# Patient Record
Sex: Female | Born: 1955 | Race: White | Hispanic: No | Marital: Married | Smoking: Never smoker
Health system: Southern US, Community
[De-identification: ages and names within clinical notes are randomized; demographics above are authoritative.]

## PROBLEM LIST (undated history)

## (undated) DIAGNOSIS — Z889 Allergy status to unspecified drugs, medicaments and biological substances status: Secondary | ICD-10-CM

## (undated) DIAGNOSIS — M1990 Unspecified osteoarthritis, unspecified site: Secondary | ICD-10-CM

## (undated) DIAGNOSIS — M51369 Other intervertebral disc degeneration, lumbar region without mention of lumbar back pain or lower extremity pain: Secondary | ICD-10-CM

## (undated) DIAGNOSIS — T783XXA Angioneurotic edema, initial encounter: Secondary | ICD-10-CM

## (undated) DIAGNOSIS — M5136 Other intervertebral disc degeneration, lumbar region: Secondary | ICD-10-CM

## (undated) DIAGNOSIS — N840 Polyp of corpus uteri: Secondary | ICD-10-CM

## (undated) DIAGNOSIS — K219 Gastro-esophageal reflux disease without esophagitis: Secondary | ICD-10-CM

## (undated) DIAGNOSIS — F329 Major depressive disorder, single episode, unspecified: Secondary | ICD-10-CM

## (undated) DIAGNOSIS — F419 Anxiety disorder, unspecified: Secondary | ICD-10-CM

## (undated) DIAGNOSIS — R51 Headache: Secondary | ICD-10-CM

## (undated) DIAGNOSIS — F32A Depression, unspecified: Secondary | ICD-10-CM

## (undated) DIAGNOSIS — M549 Dorsalgia, unspecified: Secondary | ICD-10-CM

## (undated) DIAGNOSIS — L439 Lichen planus, unspecified: Secondary | ICD-10-CM

## (undated) DIAGNOSIS — K589 Irritable bowel syndrome without diarrhea: Secondary | ICD-10-CM

## (undated) DIAGNOSIS — N943 Premenstrual tension syndrome: Secondary | ICD-10-CM

## (undated) HISTORY — DX: Irritable bowel syndrome, unspecified: K58.9

## (undated) HISTORY — PX: TUBAL LIGATION: SHX77

## (undated) HISTORY — DX: Polyp of corpus uteri: N84.0

## (undated) HISTORY — DX: Lichen planus, unspecified: L43.9

## (undated) HISTORY — PX: WISDOM TOOTH EXTRACTION: SHX21

## (undated) HISTORY — DX: Premenstrual tension syndrome: N94.3

## (undated) HISTORY — PX: COLONOSCOPY: SHX174

## (undated) HISTORY — DX: Other intervertebral disc degeneration, lumbar region: M51.36

## (undated) HISTORY — DX: Other intervertebral disc degeneration, lumbar region without mention of lumbar back pain or lower extremity pain: M51.369

## (undated) HISTORY — PX: TONSILLECTOMY: SUR1361

## (undated) HISTORY — PX: DILATION AND CURETTAGE OF UTERUS: SHX78

## (undated) HISTORY — PX: CHOLECYSTECTOMY: SHX55

## (undated) HISTORY — DX: Angioneurotic edema, initial encounter: T78.3XXA

## (undated) HISTORY — DX: Unspecified osteoarthritis, unspecified site: M19.90

---

## 1999-04-19 ENCOUNTER — Ambulatory Visit (HOSPITAL_COMMUNITY): Admission: RE | Admit: 1999-04-19 | Discharge: 1999-04-19 | Payer: Self-pay | Admitting: Family Medicine

## 1999-04-19 ENCOUNTER — Encounter: Payer: Self-pay | Admitting: Family Medicine

## 1999-05-06 ENCOUNTER — Other Ambulatory Visit: Admission: RE | Admit: 1999-05-06 | Discharge: 1999-05-06 | Payer: Self-pay | Admitting: Obstetrics and Gynecology

## 2000-06-30 ENCOUNTER — Other Ambulatory Visit: Admission: RE | Admit: 2000-06-30 | Discharge: 2000-06-30 | Payer: Self-pay | Admitting: Obstetrics and Gynecology

## 2002-05-07 ENCOUNTER — Other Ambulatory Visit: Admission: RE | Admit: 2002-05-07 | Discharge: 2002-05-07 | Payer: Self-pay | Admitting: Obstetrics and Gynecology

## 2003-05-13 ENCOUNTER — Other Ambulatory Visit: Admission: RE | Admit: 2003-05-13 | Discharge: 2003-05-13 | Payer: Self-pay | Admitting: Obstetrics and Gynecology

## 2004-05-20 ENCOUNTER — Other Ambulatory Visit: Admission: RE | Admit: 2004-05-20 | Discharge: 2004-05-20 | Payer: Self-pay | Admitting: Obstetrics and Gynecology

## 2004-08-25 ENCOUNTER — Ambulatory Visit: Payer: Self-pay | Admitting: Internal Medicine

## 2005-01-26 ENCOUNTER — Ambulatory Visit: Payer: Self-pay | Admitting: Family Medicine

## 2006-02-08 ENCOUNTER — Other Ambulatory Visit: Admission: RE | Admit: 2006-02-08 | Discharge: 2006-02-08 | Payer: Self-pay | Admitting: Obstetrics and Gynecology

## 2007-02-14 ENCOUNTER — Other Ambulatory Visit: Admission: RE | Admit: 2007-02-14 | Discharge: 2007-02-14 | Payer: Self-pay | Admitting: Obstetrics and Gynecology

## 2008-08-06 ENCOUNTER — Other Ambulatory Visit: Admission: RE | Admit: 2008-08-06 | Discharge: 2008-08-06 | Payer: Self-pay | Admitting: Obstetrics and Gynecology

## 2009-01-25 ENCOUNTER — Emergency Department: Payer: Self-pay | Admitting: Emergency Medicine

## 2009-10-22 ENCOUNTER — Emergency Department: Payer: Self-pay | Admitting: Internal Medicine

## 2009-12-28 ENCOUNTER — Other Ambulatory Visit: Admission: RE | Admit: 2009-12-28 | Discharge: 2009-12-28 | Payer: Self-pay | Admitting: Obstetrics and Gynecology

## 2011-01-05 ENCOUNTER — Other Ambulatory Visit: Payer: Self-pay | Admitting: Obstetrics and Gynecology

## 2011-01-05 ENCOUNTER — Other Ambulatory Visit (HOSPITAL_COMMUNITY)
Admission: RE | Admit: 2011-01-05 | Discharge: 2011-01-05 | Disposition: A | Discharge status: Home / Self Care | Payer: BC Managed Care – PPO | Source: Ambulatory Visit | Attending: Obstetrics and Gynecology | Admitting: Obstetrics and Gynecology

## 2011-01-05 DIAGNOSIS — Z01419 Encounter for gynecological examination (general) (routine) without abnormal findings: Secondary | ICD-10-CM | POA: Insufficient documentation

## 2011-06-07 ENCOUNTER — Other Ambulatory Visit: Payer: Self-pay | Admitting: Obstetrics and Gynecology

## 2012-01-10 ENCOUNTER — Other Ambulatory Visit (HOSPITAL_COMMUNITY)
Admission: RE | Admit: 2012-01-10 | Discharge: 2012-01-10 | Disposition: A | Discharge status: Home / Self Care | Payer: BC Managed Care – PPO | Source: Ambulatory Visit | Attending: Obstetrics and Gynecology | Admitting: Obstetrics and Gynecology

## 2012-01-10 ENCOUNTER — Other Ambulatory Visit: Payer: Self-pay | Admitting: Obstetrics and Gynecology

## 2012-01-10 DIAGNOSIS — Z01419 Encounter for gynecological examination (general) (routine) without abnormal findings: Secondary | ICD-10-CM | POA: Insufficient documentation

## 2012-06-07 ENCOUNTER — Encounter (HOSPITAL_COMMUNITY): Payer: Self-pay | Admitting: *Deleted

## 2012-06-13 ENCOUNTER — Encounter (HOSPITAL_COMMUNITY): Payer: Self-pay | Admitting: Pharmacist

## 2012-07-11 ENCOUNTER — Other Ambulatory Visit: Payer: Self-pay | Admitting: Obstetrics and Gynecology

## 2012-07-12 ENCOUNTER — Encounter (HOSPITAL_COMMUNITY)
Admission: RE | Disposition: A | Discharge status: Home / Self Care | Payer: Self-pay | Source: Ambulatory Visit | Attending: Obstetrics and Gynecology

## 2012-07-12 ENCOUNTER — Ambulatory Visit (HOSPITAL_COMMUNITY): Payer: BC Managed Care – PPO | Admitting: Anesthesiology

## 2012-07-12 ENCOUNTER — Ambulatory Visit (HOSPITAL_COMMUNITY)
Admission: RE | Admit: 2012-07-12 | Discharge: 2012-07-12 | Disposition: A | Discharge status: Home / Self Care | Payer: BC Managed Care – PPO | Source: Ambulatory Visit | Attending: Obstetrics and Gynecology | Admitting: Obstetrics and Gynecology

## 2012-07-12 ENCOUNTER — Encounter (HOSPITAL_COMMUNITY): Payer: Self-pay | Admitting: *Deleted

## 2012-07-12 ENCOUNTER — Encounter (HOSPITAL_COMMUNITY): Payer: Self-pay | Admitting: Anesthesiology

## 2012-07-12 DIAGNOSIS — R9389 Abnormal findings on diagnostic imaging of other specified body structures: Secondary | ICD-10-CM | POA: Insufficient documentation

## 2012-07-12 DIAGNOSIS — N950 Postmenopausal bleeding: Secondary | ICD-10-CM | POA: Insufficient documentation

## 2012-07-12 DIAGNOSIS — N840 Polyp of corpus uteri: Secondary | ICD-10-CM | POA: Insufficient documentation

## 2012-07-12 HISTORY — DX: Allergy status to unspecified drugs, medicaments and biological substances: Z88.9

## 2012-07-12 HISTORY — DX: Dorsalgia, unspecified: M54.9

## 2012-07-12 HISTORY — DX: Gastro-esophageal reflux disease without esophagitis: K21.9

## 2012-07-12 HISTORY — DX: Unspecified osteoarthritis, unspecified site: M19.90

## 2012-07-12 HISTORY — DX: Major depressive disorder, single episode, unspecified: F32.9

## 2012-07-12 HISTORY — DX: Depression, unspecified: F32.A

## 2012-07-12 HISTORY — PX: DILATATION & CURRETTAGE/HYSTEROSCOPY WITH RESECTOCOPE: SHX5572

## 2012-07-12 HISTORY — DX: Headache: R51

## 2012-07-12 LAB — BASIC METABOLIC PANEL
BUN: 13 mg/dL (ref 6–23)
CO2: 29 mEq/L (ref 19–32)
Calcium: 9 mg/dL (ref 8.4–10.5)
Chloride: 102 mEq/L (ref 96–112)
Creatinine, Ser: 0.7 mg/dL (ref 0.50–1.10)
GFR calc Af Amer: >90 mL/min (ref >90)
GFR calc non Af Amer: >90 mL/min (ref >90)
Glucose, Bld: 101 mg/dL — ABNORMAL HIGH (ref 70–99)
Potassium: 3.5 mEq/L (ref 3.5–5.1)
Sodium: 140 mEq/L (ref 135–145)

## 2012-07-12 LAB — CBC
HCT: 39.8 % (ref 36.0–46.0)
Hemoglobin: 13.1 g/dL (ref 12.0–15.0)
MCH: 30.3 pg (ref 26.0–34.0)
MCHC: 32.9 g/dL (ref 30.0–36.0)
MCV: 91.9 fL (ref 78.0–100.0)
Platelets: 283 10*3/uL (ref 150–400)
RBC: 4.33 MIL/uL (ref 3.87–5.11)
RDW: 14.3 % (ref 11.5–15.5)
WBC: 5.6 10*3/uL (ref 4.0–10.5)

## 2012-07-12 SURGERY — DILATATION & CURETTAGE/HYSTEROSCOPY WITH RESECTOCOPE
Anesthesia: General | Wound class: Clean Contaminated

## 2012-07-12 MED ORDER — ONDANSETRON HCL 4 MG/2ML IJ SOLN
INTRAMUSCULAR | Status: DC | PRN
  Administered 2012-07-12: 4 mg via INTRAVENOUS

## 2012-07-12 MED ORDER — MIDAZOLAM HCL 5 MG/5ML IJ SOLN
INTRAMUSCULAR | Status: DC | PRN
  Administered 2012-07-12: 2 mg via INTRAVENOUS

## 2012-07-12 MED ORDER — LACTATED RINGERS IV SOLN
INTRAVENOUS | Status: DC
  Administered 2012-07-12 (×2): via INTRAVENOUS

## 2012-07-12 MED ORDER — KETOROLAC TROMETHAMINE 30 MG/ML IJ SOLN
INTRAMUSCULAR | Status: AC
  Filled 2012-07-12: qty 1

## 2012-07-12 MED ORDER — FENTANYL CITRATE 0.05 MG/ML IJ SOLN
INTRAMUSCULAR | Status: DC | PRN
  Administered 2012-07-12 (×2): 25 ug via INTRAVENOUS
  Administered 2012-07-12: 50 ug via INTRAVENOUS

## 2012-07-12 MED ORDER — BUPIVACAINE HCL (PF) 0.25 % IJ SOLN
INTRAMUSCULAR | Status: AC
  Filled 2012-07-12: qty 30

## 2012-07-12 MED ORDER — IBUPROFEN 800 MG PO TABS: 800.0000 mg | ORAL_TABLET | Freq: Three times a day (TID) | ORAL | Status: DC | PRN

## 2012-07-12 MED ORDER — DEXAMETHASONE SODIUM PHOSPHATE 10 MG/ML IJ SOLN
INTRAMUSCULAR | Status: AC
  Filled 2012-07-12: qty 1

## 2012-07-12 MED ORDER — MEPERIDINE HCL 25 MG/ML IJ SOLN: 6.2500 mg | INTRAMUSCULAR | Status: DC | PRN

## 2012-07-12 MED ORDER — KETOROLAC TROMETHAMINE 30 MG/ML IJ SOLN
INTRAMUSCULAR | Status: DC | PRN
  Administered 2012-07-12: 30 mg via INTRAVENOUS

## 2012-07-12 MED ORDER — LIDOCAINE HCL (CARDIAC) 20 MG/ML IV SOLN
INTRAVENOUS | Status: DC | PRN
  Administered 2012-07-12: 50 mg via INTRAVENOUS

## 2012-07-12 MED ORDER — FENTANYL CITRATE 0.05 MG/ML IJ SOLN
25.0000 ug | INTRAMUSCULAR | Status: DC | PRN
  Administered 2012-07-12 (×2): 50 ug via INTRAVENOUS

## 2012-07-12 MED ORDER — PROMETHAZINE HCL 25 MG/ML IJ SOLN: 6.2500 mg | INTRAMUSCULAR | Status: DC | PRN

## 2012-07-12 MED ORDER — DOXYCYCLINE HYCLATE 50 MG PO CAPS: 100.0000 mg | ORAL_CAPSULE | Freq: Every day | ORAL | Status: DC

## 2012-07-12 MED ORDER — FENTANYL CITRATE 0.05 MG/ML IJ SOLN
INTRAMUSCULAR | Status: AC
  Filled 2012-07-12: qty 2

## 2012-07-12 MED ORDER — LACTATED RINGERS IV SOLN: INTRAVENOUS | Status: DC

## 2012-07-12 MED ORDER — PROPOFOL 10 MG/ML IV EMUL
INTRAVENOUS | Status: AC
  Filled 2012-07-12: qty 20

## 2012-07-12 MED ORDER — IBUPROFEN 200 MG PO TABS: 200.0000 mg | ORAL_TABLET | Freq: Three times a day (TID) | ORAL | Status: DC | PRN

## 2012-07-12 MED ORDER — LIDOCAINE HCL (CARDIAC) 20 MG/ML IV SOLN
INTRAVENOUS | Status: AC
  Filled 2012-07-12: qty 5

## 2012-07-12 MED ORDER — FENTANYL CITRATE 0.05 MG/ML IJ SOLN
INTRAMUSCULAR | Status: AC
  Administered 2012-07-12: 50 ug via INTRAVENOUS
  Filled 2012-07-12: qty 2

## 2012-07-12 MED ORDER — DEXAMETHASONE SODIUM PHOSPHATE 10 MG/ML IJ SOLN
INTRAMUSCULAR | Status: DC | PRN
  Administered 2012-07-12: 10 mg via INTRAVENOUS

## 2012-07-12 MED ORDER — BUPIVACAINE HCL (PF) 0.25 % IJ SOLN
INTRAMUSCULAR | Status: DC | PRN
  Administered 2012-07-12: 20 mL

## 2012-07-12 MED ORDER — MIDAZOLAM HCL 2 MG/2ML IJ SOLN
INTRAMUSCULAR | Status: AC
  Filled 2012-07-12: qty 2

## 2012-07-12 MED ORDER — ONDANSETRON HCL 4 MG/2ML IJ SOLN
INTRAMUSCULAR | Status: AC
  Filled 2012-07-12: qty 2

## 2012-07-12 MED ORDER — PROPOFOL 10 MG/ML IV BOLUS
INTRAVENOUS | Status: DC | PRN
  Administered 2012-07-12: 200 mg via INTRAVENOUS

## 2012-07-12 SURGICAL SUPPLY — 17 items
CANISTER SUCTION 2500CC (MISCELLANEOUS) ×2 IMPLANT
CATH ROBINSON RED A/P 16FR (CATHETERS) ×2 IMPLANT
CLOTH BEACON ORANGE TIMEOUT ST (SAFETY) ×2 IMPLANT
CONTAINER PREFILL 10% NBF 60ML (FORM) ×4 IMPLANT
DRESSING TELFA 8X3 (GAUZE/BANDAGES/DRESSINGS) ×2 IMPLANT
ELECT REM PT RETURN 9FT ADLT (ELECTROSURGICAL) ×2
ELECTRODE REM PT RTRN 9FT ADLT (ELECTROSURGICAL) ×1 IMPLANT
GLOVE BIOGEL M 6.5 STRL (GLOVE) ×4 IMPLANT
GLOVE BIOGEL PI IND STRL 6.5 (GLOVE) ×1 IMPLANT
GLOVE BIOGEL PI INDICATOR 6.5 (GLOVE) ×1
GOWN PREVENTION PLUS XLARGE (GOWN DISPOSABLE) ×2 IMPLANT
GOWN STRL REIN XL XLG (GOWN DISPOSABLE) ×4 IMPLANT
LOOP ANGLED CUTTING 22FR (CUTTING LOOP) IMPLANT
PACK HYSTEROSCOPY LF (CUSTOM PROCEDURE TRAY) ×2 IMPLANT
PAD OB MATERNITY 4.3X12.25 (PERSONAL CARE ITEMS) ×2 IMPLANT
TOWEL OR 17X24 6PK STRL BLUE (TOWEL DISPOSABLE) ×4 IMPLANT
WATER STERILE IRR 1000ML POUR (IV SOLUTION) ×2 IMPLANT

## 2012-07-12 NOTE — Anesthesia Preprocedure Evaluation (Addendum)
Anesthesia Evaluation  Patient identified by MRN, date of birth, ID band Patient awake    Reviewed: Allergy & Precautions, H&P , NPO status , Patient's Chart, lab work & pertinent test results  Airway Mallampati: III TM Distance: <3 FB Neck ROM: Full  Mouth opening: Limited Mouth Opening Comment: Anterior. Small mouth Dental  (+) Chipped   Pulmonary neg pulmonary ROS,          Cardiovascular negative cardio ROS      Neuro/Psych  Headaches, negative neurological ROS  negative psych ROS   GI/Hepatic negative GI ROS, Neg liver ROS, GERD-  Medicated and Controlled,  Endo/Other  negative endocrine ROSMorbid obesity  Renal/GU negative Renal ROS  negative genitourinary   Musculoskeletal negative musculoskeletal ROS (+)   Abdominal   Peds negative pediatric ROS (+)  Hematology negative hematology ROS (+)   Anesthesia Other Findings Lower front chipped tooth  Reproductive/Obstetrics negative OB ROS                          Anesthesia Physical Anesthesia Plan  ASA: III  Anesthesia Plan: General   Post-op Pain Management:    Induction: Intravenous  Airway Management Planned: LMA  Additional Equipment:   Intra-op Plan:   Post-operative Plan: Extubation in OR  Informed Consent: I have reviewed the patients History and Physical, chart, labs and discussed the procedure including the risks, benefits and alternatives for the proposed anesthesia with the patient or authorized representative who has indicated his/her understanding and acceptance.   Dental advisory given  Plan Discussed with: CRNA  Anesthesia Plan Comments: (Potential difficult intubation)       Anesthesia Quick Evaluation

## 2012-07-12 NOTE — Anesthesia Postprocedure Evaluation (Signed)
  Anesthesia Post-op Note  Patient: Kathy Oneill  Procedure(s) Performed: Procedure(s): DILATATION & CURETTAGE/HYSTEROSCOPY WITH RESECTOCOPE (N/A)  Patient is awake and responsive. Pain and nausea are reasonably well controlled. Vital signs are stable and clinically acceptable. Oxygen saturation is clinically acceptable. There are no apparent anesthetic complications at this time. Patient is ready for discharge.

## 2012-07-12 NOTE — Preoperative (Signed)
Beta Blockers   Reason not to administer Beta Blockers:Not Applicable 

## 2012-07-12 NOTE — Op Note (Signed)
07/12/2012  8:29 AM  PATIENT:  Kathy Oneill  57 y.o. female  PRE-OPERATIVE DIAGNOSIS:  Abnormal Uterine Bleeding  POST-OPERATIVE DIAGNOSIS:  Abnormal Uterine Bleeding  PROCEDURE:  Procedure(s): DILATATION & CURETTAGE/HYSTEROSCOPY WITH RESECTOCOPE (N/A)  SURGEON:  Surgeon(s) and Role:    * Jahmil Macleod J. Richardson Dopp, MD - Primary  PHYSICIAN ASSISTANT:   ASSISTANTS: none   ANESTHESIA:   general  EBL:  Total I/O In: 600 [I.V.:600] Out: 210 [Urine:200; Blood:10]  BLOOD ADMINISTERED:none  DRAINS: none   LOCAL MEDICATIONS USED:  MARCAINE     SPECIMEN:  Source of Specimen:  endometrial currettings and polyp   DISPOSITION OF SPECIMEN:  PATHOLOGY  COUNTS:  YES  TOURNIQUET:  * No tourniquets in log *  DICTATION: .Dragon Dictation  PLAN OF CARE: Discharge to home after PACU  PATIENT DISPOSITION:  PACU - hemodynamically stable.   Delay start of Pharmacological VTE agent (>24hrs) due to surgical blood loss or risk of bleeding: not applicable   Findings: Endometrial polyp   Procedure: Patient was taken to the operating room where she was placed under general anesthesia. She was placed in the dorsal lithotomy position. She was prepped and draped in the usual sterile fashion. A speculum was placed into the vaginal vault. The anterior lip of the cervix was grasped with a single-tooth tenaculum. Quarter percent Marcaine was injected at the 4 and 8:00 positions of the cervix. The cervix was then sounded to 8 cm. The cervix was dilated to approximately 4 mm. Diagnostic hysteroscope was inserted. The findings noted above. The hysteroscope was removed.  The cervix was dilated to 10 mm and the operative hysteroscope was inserted. The polyp was resected with loop electrode. The resectoscope was removed. A Sharp curet was introduced and endometrial curettings were obtained. The hysteroscope was then reinserted. There was no evidence of endometrial polyps or masses with reinsertion of the  hysteroscope. There was no evidence of perforation. Hysteroscope was then removed. The single-tooth tenaculum was removed from the anterior lip of the cervix.  Excellent hemostasis was noted. The speculum was removed from the patient's vagina. She was awakened from anesthesia taken care to the recovery room awake and in stable condition. Sponge lap and needle counts were correct x2.  Glycine deficit was 25cc.

## 2012-07-12 NOTE — Transfer of Care (Signed)
Immediate Anesthesia Transfer of Care Note  Patient: Kathy Oneill  Procedure(s) Performed: Procedure(s): DILATATION & CURETTAGE/HYSTEROSCOPY WITH RESECTOCOPE (N/A)  Patient Location: PACU  Anesthesia Type:General  Level of Consciousness: awake, alert  and oriented  Airway & Oxygen Therapy: Patient Spontanous Breathing and Patient connected to nasal cannula oxygen  Post-op Assessment: Report given to PACU RN  Post vital signs: Reviewed  Complications: No apparent anesthesia complications

## 2012-07-12 NOTE — H&P (Signed)
Date of Initial H&P:05/22/2012  History reviewed, patient examined, no change in status, stable for surgery.  pt with postmenopausal bleeding and thickened endometrium on ultrasound for hysteroscopy D&C and possible polypectomy    cv rrr  Lungs clear  Abd: soft nontender +BS  Ext no edema  R/B/A of the above surgery discussed with the patient including but not limited to infection , bleeding, damage to uterus/ perforation with the need for further surgery. Patient voiced understanding and desires to proceed.

## 2012-07-14 ENCOUNTER — Encounter (HOSPITAL_COMMUNITY): Payer: Self-pay | Admitting: Obstetrics and Gynecology

## 2013-01-09 ENCOUNTER — Ambulatory Visit (INDEPENDENT_AMBULATORY_CARE_PROVIDER_SITE_OTHER): Payer: BC Managed Care – PPO | Admitting: Podiatry

## 2013-01-09 ENCOUNTER — Encounter: Payer: Self-pay | Admitting: Podiatry

## 2013-01-09 VITALS — BP 131/68 | HR 70 | Resp 16 | Ht 67.0 in | Wt 255.0 lb

## 2013-01-09 DIAGNOSIS — M722 Plantar fascial fibromatosis: Secondary | ICD-10-CM

## 2013-01-09 NOTE — Progress Notes (Signed)
Subjective:     Patient ID: Kathy Oneill, female   DOB: [birth year], 57 y.o.   MRN: [id number]  HPI patient states that my heel is still killing me even with the boot. States it is more on the outside and the very bottom versus the inside of the heel and does not hurt when getting up but hurts throughout the day   Review of Systems  All other systems reviewed and are negative.       Objective:   Physical Exam  Nursing note and vitals reviewed. Cardiovascular: Intact distal pulses.   Musculoskeletal: Normal range of motion.  Neurological: She is alert.  Skin: Skin is warm.   patient right plantar lateral and central plantar heel are very tender more in a proximal direction. Mild discomfort noted on the medial fascial band     Assessment:     Probable compensation leading to central and lateral band fashion symptoms right with inflammation noted    Plan:     Reviewed conditions and treatment options. Have recommended E. Pat treatment and explained this procedure scheduled for procedure

## 2013-01-10 ENCOUNTER — Ambulatory Visit: Payer: Self-pay | Admitting: Podiatry

## 2013-01-14 ENCOUNTER — Ambulatory Visit (INDEPENDENT_AMBULATORY_CARE_PROVIDER_SITE_OTHER): Payer: BC Managed Care – PPO | Admitting: Podiatry

## 2013-01-14 ENCOUNTER — Encounter: Payer: Self-pay | Admitting: Podiatry

## 2013-01-14 VITALS — BP 118/74 | HR 78 | Resp 12

## 2013-01-14 DIAGNOSIS — M722 Plantar fascial fibromatosis: Secondary | ICD-10-CM

## 2013-01-14 NOTE — Progress Notes (Signed)
Subjective:     Patient ID: Kathy Oneill, female   DOB: 1956/03/03, 57 y.o.   MRN: [id number]  HPI patient's plantar heel on the plantar lateral side is tender when pressed at the current time   Review of Systems     Objective:   Physical Exam    neurovascular status is intact with pain to palpation of the above mentioned area Assessment:     Proximal lateral plantar fasciitis right    Plan:     E. Pat was performed today. I was able to get to 3.2 energy level 2,500 shocks 15 frequency

## 2013-01-15 ENCOUNTER — Other Ambulatory Visit: Payer: Self-pay | Admitting: Obstetrics and Gynecology

## 2013-01-15 ENCOUNTER — Other Ambulatory Visit (HOSPITAL_COMMUNITY)
Admission: RE | Admit: 2013-01-15 | Discharge: 2013-01-15 | Disposition: A | Discharge status: Home / Self Care | Payer: BC Managed Care – PPO | Source: Ambulatory Visit | Attending: Obstetrics and Gynecology | Admitting: Obstetrics and Gynecology

## 2013-01-15 DIAGNOSIS — Z1151 Encounter for screening for human papillomavirus (HPV): Secondary | ICD-10-CM | POA: Insufficient documentation

## 2013-01-15 DIAGNOSIS — Z01419 Encounter for gynecological examination (general) (routine) without abnormal findings: Secondary | ICD-10-CM | POA: Insufficient documentation

## 2013-01-23 ENCOUNTER — Encounter: Payer: Self-pay | Admitting: Podiatry

## 2013-01-23 ENCOUNTER — Ambulatory Visit (INDEPENDENT_AMBULATORY_CARE_PROVIDER_SITE_OTHER): Payer: BC Managed Care – PPO | Admitting: Podiatry

## 2013-01-23 VITALS — BP 123/68 | HR 77 | Resp 16

## 2013-01-23 DIAGNOSIS — M722 Plantar fascial fibromatosis: Secondary | ICD-10-CM

## 2013-01-23 NOTE — Progress Notes (Signed)
Subjective:     Patient ID: Kathy Oneill, female   DOB: 05/30/1955, 57 y.o.   MRN: [id number]  HPI patient states I'm still having pain. Points to the bottom of the heel besides the inside of the heels stating it seems a little bit better but still noticeable   Review of Systems     Objective:   Physical Exam Pain noted upon palpation    Assessment:     Plantar fasciitis right heel and plantar pain right calcaneus    Plan:     Today I did E. Pat 1200 on the medial side 1200 plantar hitting the poles up to 3.8 power 2500 pulses 15 frequency tolerated with

## 2013-01-24 ENCOUNTER — Other Ambulatory Visit: Payer: Self-pay

## 2013-01-31 ENCOUNTER — Ambulatory Visit: Payer: BC Managed Care – PPO | Admitting: Podiatry

## 2013-01-31 ENCOUNTER — Ambulatory Visit (INDEPENDENT_AMBULATORY_CARE_PROVIDER_SITE_OTHER): Payer: BC Managed Care – PPO | Admitting: Podiatry

## 2013-01-31 ENCOUNTER — Encounter: Payer: Self-pay | Admitting: Podiatry

## 2013-01-31 ENCOUNTER — Ambulatory Visit: Payer: BC Managed Care – PPO

## 2013-01-31 VITALS — BP 125/78 | HR 74 | Resp 16 | Ht 67.0 in | Wt 230.0 lb

## 2013-01-31 DIAGNOSIS — M722 Plantar fascial fibromatosis: Secondary | ICD-10-CM

## 2013-01-31 NOTE — Progress Notes (Signed)
  Subjective:    Patient ID: Kathy Oneill, female    DOB: [birth year], 57 y.o.   MRN: [id number]  HPI Comments: Doing good , except for this foot pain , right plantar heel, think its a little bit better      Review of Systems  All other systems reviewed and are negative.       Objective:   Physical Exam        Assessment & Plan:

## 2013-01-31 NOTE — Progress Notes (Signed)
Subjective:     Patient ID: Kathy Oneill, female   DOB: 13-Jun-1955, 57 y.o.   MRN: [id number]  HPI patient's heel continues to improve with mild plantar pain noted   Review of Systems     Objective:   Physical Exam Discomfort when palpated is minimal at the current time    Assessment:     Improved plantar fasciitis right    Plan:     Going from a plantar approach I went at the area of intense discomfort and utilized E. Pat treatment 4.6 on intensity 2500 shocks

## 2013-02-28 ENCOUNTER — Ambulatory Visit (INDEPENDENT_AMBULATORY_CARE_PROVIDER_SITE_OTHER): Payer: BC Managed Care – PPO | Admitting: Podiatry

## 2013-02-28 VITALS — BP 115/65 | HR 72 | Resp 16 | Ht 67.0 in | Wt 235.0 lb

## 2013-02-28 DIAGNOSIS — M722 Plantar fascial fibromatosis: Secondary | ICD-10-CM

## 2013-02-28 MED ORDER — TRIAMCINOLONE ACETONIDE 10 MG/ML IJ SUSP
10.0000 mg | Freq: Once | INTRAMUSCULAR | Status: AC
  Administered 2013-02-28: 10 mg

## 2013-02-28 NOTE — Progress Notes (Signed)
Pt states by the end of the day her foot really hurts, and she still ices.

## 2013-03-04 MED ORDER — TRIAMCINOLONE ACETONIDE 10 MG/ML IJ SUSP
10.0000 mg | Freq: Once | INTRAMUSCULAR | Status: AC
  Administered 2013-03-04: 10 mg

## 2013-03-04 NOTE — Progress Notes (Signed)
Subjective:     Patient ID: Kathy Oneill, female   DOB: Jul 25, 1955, 57 y.o.   MRN: [id number]  HPI patient complains that her heel is still bothering her by the end of the day and that ice does help but she still gets intense discomfort   Review of Systems     Objective:   Physical Exam Neurovascular status intact with no other health history issues and pain to palpation plantar heel    Assessment:     Plantar fasciitis still noted heel    Plan:     Do to intense discomfort did reinject the plantar fascia 3 mg Kenalog 5 of Xylocaine Marcaine mixture and advised on physical therapy anti-inflammatories at the current time reappoint her recheck did inject the lateral side plantar heel

## 2013-03-21 HISTORY — PX: ANTERIOR CRUCIATE LIGAMENT REPAIR: SHX115

## 2013-03-28 ENCOUNTER — Ambulatory Visit: Payer: BC Managed Care – PPO | Admitting: Podiatry

## 2013-04-02 ENCOUNTER — Other Ambulatory Visit: Payer: Self-pay | Admitting: Gastroenterology

## 2013-04-02 DIAGNOSIS — R1013 Epigastric pain: Secondary | ICD-10-CM

## 2013-04-02 DIAGNOSIS — R110 Nausea: Secondary | ICD-10-CM

## 2013-04-05 ENCOUNTER — Ambulatory Visit
Admission: RE | Admit: 2013-04-05 | Discharge: 2013-04-05 | Disposition: A | Discharge status: Home / Self Care | Payer: BC Managed Care – PPO | Source: Ambulatory Visit | Attending: Gastroenterology | Admitting: Gastroenterology

## 2013-04-05 DIAGNOSIS — R1013 Epigastric pain: Secondary | ICD-10-CM

## 2013-04-05 DIAGNOSIS — R110 Nausea: Secondary | ICD-10-CM

## 2013-04-10 ENCOUNTER — Ambulatory Visit: Payer: BC Managed Care – PPO | Admitting: Podiatry

## 2013-04-11 ENCOUNTER — Ambulatory Visit: Payer: BC Managed Care – PPO | Admitting: Podiatry

## 2013-04-18 ENCOUNTER — Ambulatory Visit (INDEPENDENT_AMBULATORY_CARE_PROVIDER_SITE_OTHER): Payer: BC Managed Care – PPO | Admitting: General Surgery

## 2013-04-18 ENCOUNTER — Encounter (INDEPENDENT_AMBULATORY_CARE_PROVIDER_SITE_OTHER): Payer: Self-pay | Admitting: General Surgery

## 2013-04-18 DIAGNOSIS — K8050 Calculus of bile duct without cholangitis or cholecystitis without obstruction: Secondary | ICD-10-CM

## 2013-04-18 DIAGNOSIS — K8020 Calculus of gallbladder without cholecystitis without obstruction: Secondary | ICD-10-CM

## 2013-04-18 NOTE — Progress Notes (Signed)
Patient ID: Kathy Oneill, female   DOB: 1955-05-13, 58 y.o.   MRN: [id number]  Chief Complaint  Patient presents with  . New Evaluation    g.b    HPI Kathy Oneill is a 59 y.o. female.  The patient is a 58 year old female who is referred by Dr. Evette Cristal for an evaluation of gallstones and pain. The patient states that her initial episode began on January 1 after eating a meal. She states that since that time she's had some bloating, diarrhea and on-off, and some nausea. She states she cannot relate any specific meals.  Of note the patient does have a history of IBS and states that this is not similar to that. HPI  Past Medical History  Diagnosis Date  . SVD (spontaneous vaginal delivery)     x 2  . Depression     history - no meds currently  . Hx of seasonal allergies   . GERD (gastroesophageal reflux disease)   . Headache(784.0)     otc meds  . Back pain     L2-L3  . Arthritis     hands feet    Past Surgical History  Procedure Laterality Date  . Dilation and curettage of uterus    . Tonsillectomy    . Tubal ligation    . Wisdom tooth extraction    . Dilatation & currettage/hysteroscopy with resectocope N/A 07/12/2012    Procedure: DILATATION & CURETTAGE/HYSTEROSCOPY WITH RESECTOCOPE;  Surgeon: Dorien Chihuahua. Richardson Dopp, MD;  Location: [facility];  Service: Gynecology;  Laterality: N/A;    No family history on file.  Social History History  Substance Use Topics  . Smoking status: Never Smoker   . Smokeless tobacco: Never Used  . Alcohol Use: Yes     Comment: socially    Allergies  Allergen Reactions  . Codeine Nausea And Vomiting    Current Outpatient Prescriptions  Medication Sig Dispense Refill  . aspirin EC 81 MG tablet Take 162 mg by mouth daily.      . calcium carbonate (OS-CAL) 600 MG TABS Take 1,200 mg by mouth daily.      . cetirizine (ZYRTEC) 10 MG tablet Take 10 mg by mouth daily.      Marland Kitchen EPINEPHrine (EPI-PEN) 0.3 mg/0.3 mL DEVI Inject 0.3 mg into the  muscle once.      . hydrOXYzine (ATARAX/VISTARIL) 25 MG tablet Take 25 mg by mouth every 8 (eight) hours as needed (for allergies).      Marland Kitchen ibuprofen (ADVIL) 800 MG tablet Take 1 tablet (800 mg total) by mouth every 8 (eight) hours as needed for pain.  30 tablet  0  . loteprednol (ALREX) 0.2 % SUSP 1 drop 2 (two) times daily as needed (for watery eyes).      . methylPREDNIsolone (MEDROL DOSPACK) 4 MG tablet       . montelukast (SINGULAIR) 10 MG tablet Take 10 mg by mouth daily.      . OMEGA-3 KRILL OIL 300 MG CAPS Take 1 capsule by mouth daily. Mega Red      . OVER THE COUNTER MEDICATION Take 1 capsule by mouth daily. Green Food Supplement      . ranitidine (ZANTAC) 150 MG tablet Take 150 mg by mouth 2 (two) times daily.       No current facility-administered medications for this visit.    Review of Systems Review of Systems  Constitutional: Negative.   HENT: Negative.   Respiratory: Negative.   Cardiovascular: Negative.   Gastrointestinal: Positive  for nausea, abdominal pain and diarrhea.  Neurological: Negative.   All other systems reviewed and are negative.    There were no vitals taken for this visit.  Physical Exam Physical Exam  Constitutional: She is oriented to person, place, and time. She appears well-developed and well-nourished.  HENT:  Head: Normocephalic and atraumatic.  Eyes: Conjunctivae and EOM are normal. Pupils are equal, round, and reactive to light.  Neck: Normal range of motion. Neck supple.  Cardiovascular: Normal rate, regular rhythm and normal heart sounds.   Pulmonary/Chest: Effort normal and breath sounds normal.  Abdominal: Soft. Bowel sounds are normal. She exhibits no distension and no mass. There is no tenderness. There is no rebound and no guarding.  Musculoskeletal: Normal range of motion.  Neurological: She is alert and oriented to person, place, and time.  Skin: Skin is warm and dry.  Psychiatric: She has a normal mood and affect.    Data  Reviewed Ultrasound reveals gallstones,no signs of acute cholecystitis  Assessment    A 58 year old female with gallstones     Plan    1. We'll proceed to the operating room for laparoscopic cholecystectomy with IOC 2.All risks and benefits were discussed with the patient to generally include: infection, bleeding, possible need for post op ERCP, damage to the bile ducts, and bile leak. Alternatives were offered and described.  All questions were answered and the patient voiced understanding of the procedure and wishes to proceed at this point with a laparoscopic cholecystectomy         Marigene EhlersRamirez Jr., Jed LimerickArmando 04/18/2013, 10:26 AM

## 2013-04-24 ENCOUNTER — Ambulatory Visit: Payer: BC Managed Care – PPO | Admitting: Podiatry

## 2013-04-29 ENCOUNTER — Encounter: Payer: Self-pay | Admitting: Podiatry

## 2013-04-29 ENCOUNTER — Ambulatory Visit (INDEPENDENT_AMBULATORY_CARE_PROVIDER_SITE_OTHER): Payer: BC Managed Care – PPO | Admitting: Podiatry

## 2013-04-29 VITALS — BP 124/81 | HR 77 | Resp 16 | Ht 67.0 in | Wt 240.0 lb

## 2013-04-29 DIAGNOSIS — M722 Plantar fascial fibromatosis: Secondary | ICD-10-CM

## 2013-04-29 NOTE — Progress Notes (Signed)
Subjective:     Patient ID: Kathy Oneill, female   DOB: 05/17/1955, 58 y.o.   MRN: [id number]  HPI patient presents stating my heel is better but I still notice a little bit of discomfort on the outside if I am real active   Review of Systems     Objective:   Physical Exam Neurovascular status intact no health history changes were noted with pain of a mild nature in the plantar center and lateral side of the calcaneus    Assessment:     Plantar fasciitis improved but still mildly tender plantar aspect of the right heel    Plan:     Discussed the importance of physical therapy and night splint usage. Gave instructions on tight tissues I want her to wear and we may reinject if symptoms were to persist the organ to try to hold off at the current time

## 2013-05-10 ENCOUNTER — Other Ambulatory Visit (INDEPENDENT_AMBULATORY_CARE_PROVIDER_SITE_OTHER): Payer: Self-pay | Admitting: General Surgery

## 2013-05-10 ENCOUNTER — Other Ambulatory Visit (INDEPENDENT_AMBULATORY_CARE_PROVIDER_SITE_OTHER): Payer: Self-pay | Admitting: *Deleted

## 2013-05-10 DIAGNOSIS — K8010 Calculus of gallbladder with chronic cholecystitis without obstruction: Secondary | ICD-10-CM

## 2013-05-10 MED ORDER — OXYCODONE-ACETAMINOPHEN 5-325 MG PO TABS: 1.0000 | ORAL_TABLET | ORAL | Status: AC | PRN

## 2013-05-17 ENCOUNTER — Encounter (INDEPENDENT_AMBULATORY_CARE_PROVIDER_SITE_OTHER): Payer: Self-pay

## 2013-05-17 ENCOUNTER — Telehealth (INDEPENDENT_AMBULATORY_CARE_PROVIDER_SITE_OTHER): Payer: Self-pay

## 2013-05-17 NOTE — Telephone Encounter (Signed)
Message copied by Maryan PulsMOORE, Sanya Kobrin on Fri May 17, 2013  2:31 PM ------      Message from: Louie CasaBARAJAS, RUTH      Created: Fri May 17, 2013 10:18 AM      Regarding: Dr. Leanord Asalamirez/Return to work Monday 05/20/13      Contact: [phone]       Patient had lap chole on 05/10/13 with Dr. Derrell Lollingamirez and wants to know if can come back to work on Monday 05/20/13, please call her Today.            Thank you ------

## 2013-05-17 NOTE — Telephone Encounter (Signed)
Called and made aware that patient may return to work.  Patient sits at a desk and she does not lift anything over 5 lbs or heavy objects.  RTW note written and will to be mailed to patient at address noted in EPIC.

## 2013-05-22 ENCOUNTER — Encounter (INDEPENDENT_AMBULATORY_CARE_PROVIDER_SITE_OTHER): Payer: Self-pay | Admitting: General Surgery

## 2013-05-22 ENCOUNTER — Ambulatory Visit (INDEPENDENT_AMBULATORY_CARE_PROVIDER_SITE_OTHER): Payer: BC Managed Care – PPO | Admitting: General Surgery

## 2013-05-22 VITALS — BP 124/80 | HR 76 | Temp 98.3°F | Resp 16 | Ht 67.0 in | Wt 237.0 lb

## 2013-05-22 DIAGNOSIS — Z9049 Acquired absence of other specified parts of digestive tract: Secondary | ICD-10-CM

## 2013-05-22 DIAGNOSIS — Z9889 Other specified postprocedural states: Secondary | ICD-10-CM

## 2013-05-22 NOTE — Progress Notes (Signed)
Patient ID: Kathy Oneill, female   DOB: 10/10/1955, 58 y.o.   MRN: [id number] Post op course 58 year old female status post laparoscopic cholecystectomy. Patient has been doing well postoperatively. She's had some periumbilical pain is slowly getting better. She is back to work at this point.  On Exam: Wounds clean dry and intact  Pathology:   Chronic cholecystitis and cholelithiasis.  This was discussed with the patient.  Assessment and Plan 58 year old female status post laparoscopic cholecystectomy 1. We discussed weightlifting restrictions for another 2-3 weeks. 2. Patient follow up as needed   Axel FillerArmando Belanna Manring, MD [facility] General & Minimally Invasive Surgery Trauma & Emergency Surgery

## 2013-06-03 ENCOUNTER — Encounter (INDEPENDENT_AMBULATORY_CARE_PROVIDER_SITE_OTHER): Payer: BC Managed Care – PPO | Admitting: General Surgery

## 2013-06-12 ENCOUNTER — Other Ambulatory Visit: Payer: Self-pay

## 2013-06-12 DIAGNOSIS — Z1231 Encounter for screening mammogram for malignant neoplasm of breast: Secondary | ICD-10-CM

## 2013-07-02 ENCOUNTER — Ambulatory Visit
Admission: RE | Admit: 2013-07-02 | Discharge: 2013-07-02 | Disposition: A | Discharge status: Home / Self Care | Payer: Self-pay | Source: Ambulatory Visit

## 2013-07-02 DIAGNOSIS — Z1231 Encounter for screening mammogram for malignant neoplasm of breast: Secondary | ICD-10-CM

## 2014-01-14 ENCOUNTER — Other Ambulatory Visit: Payer: Self-pay | Admitting: Obstetrics and Gynecology

## 2014-01-14 ENCOUNTER — Other Ambulatory Visit (HOSPITAL_COMMUNITY)
Admission: RE | Admit: 2014-01-14 | Discharge: 2014-01-14 | Disposition: A | Discharge status: Home / Self Care | Payer: BC Managed Care – PPO | Source: Ambulatory Visit | Attending: Obstetrics and Gynecology | Admitting: Obstetrics and Gynecology

## 2014-01-14 DIAGNOSIS — Z01419 Encounter for gynecological examination (general) (routine) without abnormal findings: Secondary | ICD-10-CM | POA: Diagnosis not present

## 2014-01-15 LAB — CYTOLOGY - PAP

## 2014-05-24 ENCOUNTER — Emergency Department: Payer: Self-pay | Admitting: Emergency Medicine

## 2014-05-29 ENCOUNTER — Encounter: Payer: Self-pay | Admitting: *Deleted

## 2014-05-30 ENCOUNTER — Encounter: Payer: Self-pay | Admitting: Cardiovascular Disease

## 2014-05-30 ENCOUNTER — Ambulatory Visit (INDEPENDENT_AMBULATORY_CARE_PROVIDER_SITE_OTHER): Payer: BLUE CROSS/BLUE SHIELD | Admitting: Cardiovascular Disease

## 2014-05-30 VITALS — BP 137/87 | HR 67 | Ht 67.0 in | Wt 245.0 lb

## 2014-05-30 DIAGNOSIS — R079 Chest pain, unspecified: Secondary | ICD-10-CM

## 2014-05-30 DIAGNOSIS — R0602 Shortness of breath: Secondary | ICD-10-CM

## 2014-05-30 NOTE — Patient Instructions (Addendum)
MYOVIEW  Your caregiver has ordered a Stress Test with nuclear imaging. The purpose of this test is to evaluate the blood supply to your heart muscle. This procedure is referred to as a "Non-Invasive Stress Test." This is because other than having an IV started in your vein, nothing is inserted or "invades" your body. Cardiac stress tests are done to find areas of poor blood flow to the heart by determining the extent of coronary artery disease (CAD). Some patients exercise on a treadmill, which naturally increases the blood flow to your heart, while others who are  unable to walk on a treadmill due to physical limitations have a pharmacologic/chemical stress agent called Lexiscan . This medicine will mimic walking on a treadmill by temporarily increasing your coronary blood flow.   Please note: these test may take anywhere between 2-4 hours to complete This will be at our office on [address] [place]     Date of Procedure:______3/22/16_______________________________  Arrival Time for Procedure:______0800 am________________________   PLEASE NOTIFY THE OFFICE AT LEAST 24 HOURS IN ADVANCE IF YOU ARE UNABLE TO KEEP YOUR APPOINTMENT.  [phone]   How to prepare for your Myoview test:  1. Do not eat or drink after midnight 2. No caffeine for 24 hours prior to test 3. No smoking 24 hours prior to test. 4. Your medication may be taken with water.  If your doctor stopped a medication because of this test, do not take that medication. 5. Ladies, please do not wear dresses.  Skirts or pants are appropriate. Please wear a short sleeve shirt. 6. No perfume, cologne or lotion. 7. Wear comfortable walking shoes. No heels!

## 2014-05-30 NOTE — Assessment & Plan Note (Signed)
The patient's chest pain is overall atypical and likely related to GERD. However, since then she describes worsening exertional dyspnea. Due to that, I recommend further evaluation with a pharmacologic nuclear stress test. She is not able to exercise on a treadmill due to recent right knee surgery. The symptoms are not suggestive of pulmonary embolism and she has no signs of DVT. Follow-up as needed.

## 2014-05-30 NOTE — Progress Notes (Signed)
Primary care physician: Dr. Laurann Montana  HPI  This is a pleasant 59 year old female who was referred from the emergency room at [facility] for evaluation of chest pain and shortness of breath. She has no previous cardiac history. She has no history of diabetes, hypertension or hyperlipidemia. She has known history of GERD and recent right anterior cruciate ligament repair in November 2015. She is not a smoker. She has family history of coronary artery disease but not prematurely. Recently she had an episode of significant heartburn with a feeling of a lump in her throat. She became more dyspneic the day after and went to the emergency room for evaluation. EKG was unremarkable except for poor R-wave progression in the anterior leads. Cardiac enzymes were negative. Labs were unremarkable. Chest x-ray showed no acute cardiopulmonary disease. She has noticed exertional dyspnea since then but no recurrent chest pain.  Allergies  Allergen Reactions  . Codeine Nausea And Vomiting     Current Outpatient Prescriptions on File Prior to Visit  Medication Sig Dispense Refill  . aspirin EC 81 MG tablet Take 162 mg by mouth daily.    . calcium carbonate (OS-CAL) 600 MG TABS Take 1,200 mg by mouth daily.    . cetirizine (ZYRTEC) 10 MG tablet Take 10 mg by mouth daily.    Marland Kitchen EPINEPHrine (EPI-PEN) 0.3 mg/0.3 mL DEVI Inject 0.3 mg into the muscle once.    Marland Kitchen ibuprofen (ADVIL) 800 MG tablet Take 1 tablet (800 mg total) by mouth every 8 (eight) hours as needed for pain. 30 tablet 0  . loteprednol (ALREX) 0.2 % SUSP 1 drop 2 (two) times daily as needed (for watery eyes).    . OMEGA-3 KRILL OIL 300 MG CAPS Take 1 capsule by mouth daily. Mega Red    . omeprazole (PRILOSEC) 20 MG capsule Take 20 mg by mouth daily.    Marland Kitchen OVER THE COUNTER MEDICATION Take 1 capsule by mouth daily. Green Food Supplement    . ranitidine (ZANTAC) 150 MG tablet Take 150 mg by mouth 2 (two) times daily.     No current facility-administered  medications on file prior to visit.     Past Medical History  Diagnosis Date  . SVD (spontaneous vaginal delivery)     x 2  . Depression     history - no meds currently  . Hx of seasonal allergies   . GERD (gastroesophageal reflux disease)   . Headache(784.0)     otc meds  . Back pain     L2-L3  . Arthritis     hands feet  . IBS (irritable bowel syndrome)   . Premenstrual syndrome   . Migraine headache   . DDD (degenerative disc disease), lumbar   . Carpal tunnel syndrome   . Endometrial polyp   . Lichen planus   . OA (osteoarthritis)      Past Surgical History  Procedure Laterality Date  . Dilation and curettage of uterus    . Tonsillectomy    . Tubal ligation    . Wisdom tooth extraction    . Dilatation & currettage/hysteroscopy with resectocope N/A 07/12/2012    Procedure: DILATATION & CURETTAGE/HYSTEROSCOPY WITH RESECTOCOPE;  Surgeon: Dorien Chihuahua. Richardson Dopp, MD;  Location: [facility];  Service: Gynecology;  Laterality: N/A;  . Cholecystectomy       Family History  Problem Relation Age of Onset  . Cancer Mother     urine ca     History   Social History  . Marital Status:  Married    Spouse Name: N/A  . Number of Children: N/A  . Years of Education: N/A   Occupational History  . Not on file.   Social History Main Topics  . Smoking status: Never Smoker   . Smokeless tobacco: Never Used  . Alcohol Use: Yes     Comment: socially  . Drug Use: No  . Sexual Activity: Yes    Birth Control/ Protection: Surgical   Other Topics Concern  . Not on file   Social History Narrative     ROS A 10 point review of system was performed. It is negative other than that mentioned in the history of present illness.   PHYSICAL EXAM   BP 137/87 mmHg  Pulse 67  Ht 5\' 7"  (1.702 m)  Wt 245 lb (111.131 kg)  BMI 38.36 kg/m2 Constitutional: She is oriented to person, place, and time. She appears well-developed and well-nourished. No distress.  HENT: No nasal discharge.    Head: Normocephalic and atraumatic.  Eyes: Pupils are equal and round. No discharge.  Neck: Normal range of motion. Neck supple. No JVD present. No thyromegaly present.  Cardiovascular: Normal rate, regular rhythm, normal heart sounds. Exam reveals no gallop and no friction rub. No murmur heard.  Pulmonary/Chest: Effort normal and breath sounds normal. No stridor. No respiratory distress. She has no wheezes. She has no rales. She exhibits no tenderness.  Abdominal: Soft. Bowel sounds are normal. She exhibits no distension. There is no tenderness. There is no rebound and no guarding.  Musculoskeletal: Normal range of motion. She exhibits no edema and no tenderness.  Neurological: She is alert and oriented to person, place, and time. Coordination normal.  Skin: Skin is warm and dry. No rash noted. She is not diaphoretic. No erythema. No pallor.  Psychiatric: She has a normal mood and affect. Her behavior is normal. Judgment and thought content normal.     WRU:EAVWUEKG:Sinus  Rhythm  Low voltage in precordial leads.   -  Nonspecific T-abnormality.   ABNORMAL    ASSESSMENT AND PLAN

## 2014-06-10 ENCOUNTER — Ambulatory Visit (HOSPITAL_COMMUNITY): Payer: BLUE CROSS/BLUE SHIELD | Attending: Cardiovascular Disease | Admitting: Radiology

## 2014-06-10 DIAGNOSIS — R079 Chest pain, unspecified: Secondary | ICD-10-CM | POA: Diagnosis not present

## 2014-06-10 DIAGNOSIS — R0609 Other forms of dyspnea: Secondary | ICD-10-CM | POA: Diagnosis not present

## 2014-06-10 DIAGNOSIS — R0602 Shortness of breath: Secondary | ICD-10-CM | POA: Diagnosis not present

## 2014-06-10 MED ORDER — TECHNETIUM TC 99M SESTAMIBI GENERIC - CARDIOLITE
30.0000 | Freq: Once | INTRAVENOUS | Status: AC | PRN
  Administered 2014-06-10: 30 via INTRAVENOUS

## 2014-06-10 MED ORDER — REGADENOSON 0.4 MG/5ML IV SOLN
0.4000 mg | Freq: Once | INTRAVENOUS | Status: AC
  Administered 2014-06-10: 0.4 mg via INTRAVENOUS

## 2014-06-10 NOTE — Progress Notes (Signed)
[facility] SITE 3 NUCLEAR MED [address] [place], [place] [address] [phone]    Cardiology Nuclear Med Study  Kathy Oneill is a 59 y.o. female     MRN : [id number]     DOB: 04/06/1955  Procedure Date: 06/10/2014  Nuclear Med Background Indication for Stress Test:  Evaluation for Ischemia and [facility] 3/16 Dyspnea History:  n/a Cardiac Risk Factors: N/A  Symptoms:  Chest Pain, DOE and SOB   Nuclear Pre-Procedure Caffeine/Decaff Intake:  None NPO After: 7:30pm   Lungs:  clear O2 Sat: 98% on room air. IV 0.9% NS with Angio Cath:  22g  IV Site: R Hand  IV Started by:  Kathy LevanJackie Smith, RN  Chest Size (in):  42 Cup Size: F  Height: 5\' 7"  (1.702 m)  Weight:  248 lb (112.492 kg)  BMI:  Body mass index is 38.83 kg/(m^2). Tech Comments:  Rx this am    Nuclear Med Study 1 or 2 day study: 2 day  Stress Test Type:  Lexiscan  Reading MD: N/A  Order Authorizing Provider:  Lorine BearsMuhammad Arida, MD  Resting Radionuclide: Technetium 10946m Sestamibi  Resting Radionuclide Dose: 33.0 mCi on 06/12/14   Stress Radionuclide:  Technetium 8146m Sestamibi  Stress Radionuclide Dose: 33.0 mCi on 06/10/14           Stress Protocol Rest HR: 61 Stress HR: 95  Rest BP: 123/78 Stress BP: 142/86  Exercise Time (min): n/a METS: n/a   Predicted Max HR: 164 bpm % Max HR: 57.93 bpm Rate Pressure Product: [id number]   Dose of Adenosine (mg):  n/a Dose of Lexiscan: 0.4 mg  Dose of Atropine (mg): n/a Dose of Dobutamine: n/a mcg/kg/min (at max HR)  Stress Test Technologist: Kathy Oneill, EMT-P  Nuclear Technologist:  Kathy Oneill, CNMT     Rest Procedure:  Myocardial perfusion imaging was performed at rest 45 minutes following the intravenous administration of Technetium 346m Sestamibi. Rest ECG: NSR - Normal EKG  Stress Procedure:  The patient received IV Lexiscan 0.4 mg over 15-seconds.  Technetium 2446m Sestamibi injected at 30-seconds. This patient was sob and had abdominal tightness  with the Lexiscan injection. Quantitative spect images were obtained after a 45 minute delay. Stress ECG: No significant change from baseline ECG  QPS Raw Data Images:  Mild breast attenuation.  Normal left ventricular size. Stress Images:  Small, mild basal inferolateral perfusion defect. Rest Images:  Normal homogeneous uptake in all areas of the myocardium. Subtraction (SDS):  Small, mild reversible basal inferolateral perfusion defect. Transient Ischemic Dilatation (Normal <1.22):  1.07 Lung/Heart Ratio (Normal <0.45):  0.31  Quantitative Gated Spect Images QGS EDV:  100 ml QGS ESV:  33 ml  Impression Exercise Capacity:  Lexiscan with no exercise. BP Response:  Normal blood pressure response. Clinical Symptoms:  Short of breath.  ECG Impression:  No significant ST segment change suggestive of ischemia. Comparison with Prior Nuclear Study: No images to compare  Overall Impression:  Low risk stress nuclear study with a small, mild reversible basal inferolateral perfusion defect.  I suspect that this is most likely to be shifting breast artifact given prominent breast shadowing on planar images.  I doubt there is significant ischemia. .  LV Ejection Fraction: 67%.  LV Wall Motion:  NL LV Function; NL Wall Motion   Kathy Oneill 06/12/2014

## 2014-06-12 ENCOUNTER — Ambulatory Visit (HOSPITAL_COMMUNITY): Payer: BLUE CROSS/BLUE SHIELD | Attending: Family Medicine

## 2014-06-12 DIAGNOSIS — R0989 Other specified symptoms and signs involving the circulatory and respiratory systems: Secondary | ICD-10-CM

## 2014-06-12 MED ORDER — TECHNETIUM TC 99M SESTAMIBI GENERIC - CARDIOLITE
33.0000 | Freq: Once | INTRAVENOUS | Status: AC | PRN
  Administered 2014-06-12: 33 via INTRAVENOUS

## 2014-06-13 ENCOUNTER — Encounter: Payer: Self-pay | Admitting: Cardiovascular Disease

## 2014-06-13 NOTE — Telephone Encounter (Signed)
Patient called asking for her myoview she stated she stated she was very nervous  I informed her that I would ask Dr. Kirke CorinArida to read her test

## 2014-06-18 ENCOUNTER — Telehealth: Payer: Self-pay | Admitting: Cardiovascular Disease

## 2014-06-18 NOTE — Telephone Encounter (Signed)
I spoke with [name] at [facility] and she requested that a copy of Dr Jari SportsmanArida's office visit and myoview be faxed to [phone], Attn:Dorothy Trellis MomentFerguson PA-C.

## 2014-06-18 NOTE — Telephone Encounter (Signed)
New Message  [facility] was not included in report. Kristi from [facility] wanted to speak w/ Rn. Please call back and discuss.

## 2014-08-22 ENCOUNTER — Ambulatory Visit: Payer: BLUE CROSS/BLUE SHIELD | Admitting: Podiatry

## 2014-08-28 ENCOUNTER — Encounter: Payer: Self-pay | Admitting: Podiatry

## 2014-08-28 ENCOUNTER — Ambulatory Visit (INDEPENDENT_AMBULATORY_CARE_PROVIDER_SITE_OTHER): Payer: BLUE CROSS/BLUE SHIELD | Admitting: Podiatry

## 2014-08-28 VITALS — BP 130/75 | HR 75 | Resp 15

## 2014-08-28 DIAGNOSIS — M722 Plantar fascial fibromatosis: Secondary | ICD-10-CM | POA: Diagnosis not present

## 2014-08-28 MED ORDER — TRIAMCINOLONE ACETONIDE 10 MG/ML IJ SUSP
10.0000 mg | Freq: Once | INTRAMUSCULAR | Status: AC
  Administered 2014-08-28: 10 mg

## 2014-08-29 NOTE — Progress Notes (Signed)
Subjective:     Patient ID: Kathy Oneill, female   DOB: 04-24-1955, 59 y.o.   MRN: [id number]  HPI patient presents stating I'm getting a lot of pain in the plantar aspects of both my heels that are making it difficult for me to walk. Patient states that the pain has intensified recently   Review of Systems     Objective:   Physical Exam Neurovascular status intact muscle strength adequate range of motion within normal limits. Patient's noted to have discomfort in the plantar aspect of the heel region bilateral with fluid buildup around the medial band and moderate depression of the arch    Assessment:     Plantar fasciitis bilateral with inflammation along with arch depression bilateral    Plan:     H&P and conditions reviewed. Today I carefully injected the plantar heel 3 Milligan Kenalog 5 mill grams Xylocaine bilateral and applied fascial brace is to lift the arch. Reappoint to recheck in 2 weeks

## 2014-09-16 ENCOUNTER — Ambulatory Visit: Payer: BLUE CROSS/BLUE SHIELD | Admitting: Podiatry

## 2015-01-20 ENCOUNTER — Other Ambulatory Visit: Payer: Self-pay | Admitting: Obstetrics and Gynecology

## 2015-01-20 ENCOUNTER — Other Ambulatory Visit (HOSPITAL_COMMUNITY)
Admission: RE | Admit: 2015-01-20 | Discharge: 2015-01-20 | Disposition: A | Discharge status: Home / Self Care | Payer: BLUE CROSS/BLUE SHIELD | Source: Ambulatory Visit | Attending: Obstetrics and Gynecology | Admitting: Obstetrics and Gynecology

## 2015-01-20 DIAGNOSIS — Z01419 Encounter for gynecological examination (general) (routine) without abnormal findings: Secondary | ICD-10-CM | POA: Diagnosis present

## 2015-01-22 LAB — CYTOLOGY - PAP

## 2015-07-13 ENCOUNTER — Encounter: Payer: Self-pay | Admitting: Podiatry

## 2015-07-13 ENCOUNTER — Ambulatory Visit (INDEPENDENT_AMBULATORY_CARE_PROVIDER_SITE_OTHER): Payer: BLUE CROSS/BLUE SHIELD

## 2015-07-13 ENCOUNTER — Ambulatory Visit (INDEPENDENT_AMBULATORY_CARE_PROVIDER_SITE_OTHER): Payer: BLUE CROSS/BLUE SHIELD | Admitting: Podiatry

## 2015-07-13 VITALS — BP 122/77 | HR 65 | Resp 16

## 2015-07-13 DIAGNOSIS — M722 Plantar fascial fibromatosis: Secondary | ICD-10-CM

## 2015-07-13 MED ORDER — TRIAMCINOLONE ACETONIDE 10 MG/ML IJ SUSP
10.0000 mg | Freq: Once | INTRAMUSCULAR | Status: AC
  Administered 2015-07-13: 10 mg

## 2015-07-13 NOTE — Patient Instructions (Addendum)

## 2015-07-13 NOTE — Progress Notes (Signed)
Subjective:     Patient ID: Kathy Oneill, female   DOB: 02/22/1956, 60 y.o.   MRN: [id number]  HPI patient presents stating I'm getting pain in both of my arches at this time and they make it hard for me to be comfortable or to walk and I'm trying to lose weight and exercise   Review of Systems     Objective:   Physical Exam Neurovascular status intact muscle strength adequate range of motion within normal limits with patient found to have discomfort in the mid arch plantar fascia bilateral with fluid buildup and moderate depression of the arch noted with history of orthotics which have worn out due to excessive usage.    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 I did dispense a night splint with all instructions on stretching exercises associated with it and he denies therapy and I scanned for custom orthotics to reduce pressure against her arches. I did inject the mid arch area bilateral 3 mg Kenalog 5 mg Xylocaine and reviewed x-rays  Poor indicates moderate depression of the arch no indication stress fracture arthritis

## 2015-08-04 ENCOUNTER — Ambulatory Visit: Payer: BLUE CROSS/BLUE SHIELD | Admitting: *Deleted

## 2015-08-04 DIAGNOSIS — M722 Plantar fascial fibromatosis: Secondary | ICD-10-CM

## 2015-08-04 NOTE — Progress Notes (Signed)
Patient ID: Kathy Oneill, female   DOB: 06/29/1955, 60 y.o.   MRN: [id number] Patient presents for orthotic pick up.  Orthotics were tried on and found to be to narrow cutting into the right arch and the the transition to the toes too rough and dee will send back to the manufacturer for adjustments and call when they return.

## 2015-08-04 NOTE — Patient Instructions (Signed)

## 2015-08-06 ENCOUNTER — Other Ambulatory Visit: Payer: Self-pay | Admitting: Obstetrics and Gynecology

## 2015-09-01 ENCOUNTER — Ambulatory Visit: Payer: BLUE CROSS/BLUE SHIELD | Admitting: *Deleted

## 2015-09-01 DIAGNOSIS — M722 Plantar fascial fibromatosis: Secondary | ICD-10-CM

## 2015-09-01 NOTE — Patient Instructions (Signed)

## 2015-09-01 NOTE — Progress Notes (Signed)
Patient ID: Kathy Oneill, female   DOB: 04/28/1955, 60 y.o.   MRN: [id number] Patient presents for adjusted orthotic pick up.  Verbal and written break in and wear instructions given.  Patient will follow up in 4 weeks if symptoms worsen or fail to improve.

## 2015-09-28 ENCOUNTER — Encounter (HOSPITAL_COMMUNITY): Payer: Self-pay | Admitting: *Deleted

## 2015-10-13 ENCOUNTER — Other Ambulatory Visit (HOSPITAL_COMMUNITY): Payer: Self-pay | Admitting: Obstetrics and Gynecology

## 2015-10-13 NOTE — H&P (Signed)
PreOp History and Physical  for 10/14/15   HPI:  General 60 y/o presents for history and physical in preparation for hyseroscopy D&C and possible polypectomy. she reports spotting that started the first part of may . she has never had this before her last normal menses was 10/31/2013.  Pelvic ultrasound performed 07/23/2015 shows a 7.5 cm x 5.0 cm x 4.1 cm uterus. the endometrium is thickened 1.23 cm with irregular appearance.. the left ovary appears normal. the right ovary was not visualized. no adnexal masses were seen . An SHG was performed in the office 08/06/2015. The endometrium at that time was thin at 4.3 mm. no wall defects were seen. Endometrial biopsy showed proliferative endometrium. pt has continued to have spotting and desires further evaluation and management via hysteroscopy D&C possible polypectomy.  She is also complaining of urinary frequency for the past week. she denies dysuria. no hematuria.  Current Medication:  Taking  Zantac(RaNITidine HCl) 150 MG Tablet 1 tablet Orally twice a day     EpiPen(EPINEPHrine) 1 MG/ML Device as directed Intramuscular Dr Corinda Gubler     Ativan(LORazepam) 0.5 MG Tablet 1 tablet as needed Orally Once a day as needed     Zyrtec Allergy(Cetirizine HCl) 10 MG Tablet 1 tablet Orally QHS Dr. Corinda Gubler     Vitamin D3 1000 UNIT Tablet 1 tablet Orally Once a day     Calcium + D(Calcium-Vitamin D) 315-200 MG-UNIT Tablet 1 tablet with meals Orally Twice a day     Multivitamins Capsule 1 capsule Orally daily     Medication List reviewed and reconciled with the patient   Medical History:   Irritable bowel syndrome     DDD L2-L3     endometrial polyp     lichen planus (Dr. Margo Aye)     OA of hands, Dr. Teressa Senter     hands bone spurs     h/o angioedema/anaphylaxis, Dr Lucie Leather      Allergies/Intolerance:   Codeine (for allergy): Side Effects - vomiting     Vioxx - vomiting   Gyn History:   Sexual activity currently sexually active. Periods :  postmenopausal. LMP 11/01/13. Birth control BTL. Last pap smear date 01/20/15. Last mammogram date 07/02/13. Denies Abnormal pap smear. Denies STD. GYN procedures Endometrial Biopsy 05/05/05.   OB History:   Number of pregnancies 2. Pregnancy # 1 live birth, vaginal delivery, girl. Pregnancy # 2 live birth, vaginal delivery,boy.   Surgical History:   Tubal ligation     D & C for endometrial polyps 06     Tonsillectomy     Hysteroscopy & D&C 06/21/12     gallbladder removed 04/2013     rt acl repair 01/2014   Hospitalization:   Childbirth x 2 SVD   Family History:   Father: deceased 19 yrs, alcoholic liver disease, diagnosed with Cirrhosis    Mother: unknown, cervical CA    Paternal Grand Father: deceased, Natural Causes    Paternal Grand Mother: deceased, Natural Causes    Maternal Grand Father: deceased, Unknown    Maternal Grand Mother: deceased, Diabetes Mellitus, Hypertension, CAD, , diagnosed with DM    Brother 1: alive, Half Sibling - No known medical problems    Sister 1: alive, Half Sibling - Back surgeries    Sister 2: alive, Half Sibling - No known medical problems    Maternal aunt: cervical ca, CAD, sudden death at age 15    1 brother(s) , 1 sister(s) . 1 son(s) , 2 daughter(s) .  neg for colon cancer.  Social History:  General Tobacco use cigarettes: Never smoked, Tobacco history last updated 10/09/2015.  no EXPOSURE TO PASSIVE SMOKE.  Alcohol: yes, social, wine, liquor.  Caffeine: yes, 3 servings per day, coffee, soda, rare,, tea, rare.  no Recreational drug use.  Exercise: yes, minimal.  Marital Status: Married.  Children: 2, 1, Boys, 1, girls, & 1 adopted daughter.  OCCUPATION: unemployed.  Seat belt use: yes.  Tobacco Exposure: Husband smokes around pt occassionally.  ROS: CONSTITUTIONAL none" label="Fatigue" value="" options="no,yes" propid="91" itemid="[id number]" categoryid="10464" encounterid="[id number]"Fatigue none. none today" label="Fever" value=""  options="no,yes" propid="91" itemid="10467" categoryid="10464" encounterid="[id number]"Fever none today.  CARDIOLOGY none" label="Chest pain" value="" options="no,yes" propid="91" itemid="193603" categoryid="10488" encounterid="[id number]"Chest pain none.  RESPIRATORY no" label="Shortness of breath" value="" options="no" propid="91" itemid="270013" categoryid="138132" encounterid="[id number]"Shortness of breath no. no" label="Cough" value="" options="no,yes" propid="91" itemid="172745" categoryid="138132" encounterid="[id number]"Cough no.  GASTROENTEROLOGY none" label="Appetite change" value="" options="no,yes" propid="91" itemid="193447" categoryid="10494" encounterid="[id number]"Appetite change none. no" label="Change in bowel habits" value="" options="no,yes" propid="91" itemid="193449" categoryid="10494" encounterid="[id number]"Change in bowel habits no.  FEMALE REPRODUCTIVE no" label="Breast lumps or discharge" value="" options="no,yes" propid="91" itemid="196298" categoryid="10525" encounterid="[id number]"Breast lumps or discharge no. none" label="Breast pain" value="" options="no,yes" propid="91" itemid="186083" categoryid="10525" encounterid="[id number]"Breast pain none. none" label="Dyspareunia" value="" options="no,yes" propid="91" itemid="138198" categoryid="10525" encounterid="[id number]"Dyspareunia none. no" label="Dysuria" value="" options="no,yes" propid="91" itemid="202654" categoryid="10525" encounterid="[id number]"Dysuria no. none" label="Pelvic pain" value="" options="no,yes" propid="91" itemid="186082" categoryid="10525" encounterid="[id number]"Pelvic pain none. NA" label="Regular menses" value="" options="no,yes" propid="91" itemid="199173" categoryid="10525" encounterid="[id number]"Regular menses NA. no" label="Unusual vaginal discharge" value="" options="no,yes" propid="91" itemid="278230" categoryid="10525" encounterid="[id number]"Unusual vaginal discharge no. no" label="Vaginal itching" value="" options="no,yes"  propid="91" itemid="278942" categoryid="10525" encounterid="[id number]"Vaginal itching no. no" label="Vulvar/labial lesion" value="" options="no,yes" propid="91" itemid="278837" categoryid="10525" encounterid="[id number]"Vulvar/labial lesion no.  NEUROLOGY none" label="Migraines" value="" options="no,yes" propid="91" itemid="193627" categoryid="12512" encounterid="[id number]"Migraines none. none" label="Tingling/numbness" value="" options="no,yes" propid="91" itemid="12514" categoryid="12512" encounterid="[id number]"Tingling/numbness none. none" label="Visual changes" value="" options="no,yes" propid="91" itemid="193467" categoryid="12512" encounterid="[id number]"Visual changes none.  PSYCHOLOGY no" label="Depression" value="" options="" propid="91" itemid="275919" categoryid="10520" encounterid="[id number]"Depression no.  SKIN no" label="Rash" value="" options="no,yes" propid="91" itemid="269383" categoryid="202750" encounterid="[id number]"Rash no. no" label="Suspicious lesions" value="" options="no,yes" propid="91" itemid="202757" categoryid="202750" encounterid="[id number]"Suspicious lesions no.  ENDOCRINOLOGY none" label="Hot flashes" value="" options="no,yes" propid="91" itemid="202624" categoryid="12508" encounterid="[id number]"Hot flashes none. no unintentional" label="Weight gain" value="" options="no,yes" propid="91" itemid="193436" categoryid="12508" encounterid="[id number]"Weight gain no unintentional. none" label="Weight loss" value="" options="no,yes" propid="91" itemid="138164" categoryid="12508" encounterid="[id number]"Weight loss none.  HEMATOLOGY/LYMPH no" label="Anemia" value="" options="no,yes" propid="91" itemid="193454" categoryid="138157" encounterid="[id number]"Anemia no.    Objective: Vitals:  Wt 240, Wt change 2 lb, Pulse sitting 60, BP sitting 112/71  Past Results:  Examination:  General Examination McCoy,Tiffany 10/09/2015 10:01:03 AM &gt; , for pelvic exam only" label="CHAPERONE PRESENT"  categoryPropId="[id number]" examid="[id number]"CHAPERONE PRESENT McCoy,Tiffany 10/09/2015 10:01:03 AM > , for pelvic exam only.  Physical Examination: GENERAL in NAD, pleasant" label="Patient appears"Patient appears in NAD, pleasant. well developed" label="Build:"Build: well developed. overweight" label="General Appearance:"General Appearance: overweight. caucasian" label="Race:"Race: caucasian.  LUNGS clear to auscultation" label="Breath sounds:"Breath sounds: clear to auscultation. no" label="Dyspnea:"Dyspnea: no.  HEART none" label="Murmurs:"Murmurs: none. normal" label="Rate:"Rate: normal. regular" label="Rhythm:"Rhythm: regular.  ABDOMEN no masses,tenderness,organomegaly, no CVAT" label="General:"General: no masses,tenderness,organomegaly, no CVAT.  FEMALE GENITOURINARY no mass, non tender" label="Adnexa:"Adnexa: no mass, non tender. normal, no lesions" label="Anus/perineum:"Anus/perineum: normal, no lesions. normal appearance , no lesions/discharge/bleeding,, good pelvic support , external os normal " label="Cervix/ cuff:"Cervix/ cuff: normal appearance , no lesions/discharge/bleeding,, good pelvic support , external os normal . normal, no lesions, no skin discoloration, no lymphadenopathy" label="External genitalia:"External genitalia: normal, no lesions, no skin discoloration, no lymphadenopathy. normal external meatus" label="Urethra:"Urethra: normal external meatus. normal size/shape/consistency, freely mobile, non tender" label="Uterus:"Uterus: normal size/shape/consistency, freely mobile, non tender. deferred" label="Rectum:"Rectum: deferred. pink/moist mucosa, no lesions, no abnormal discharge,  odorless" label="Vagina:"Vagina: pink/moist mucosa, no lesions, no abnormal discharge, odorless. normal, no lesions, no skin discoloration, non tender" label="Vulva:"Vulva: normal, no lesions, no skin discoloration, non tender.  EXTREMITIES FROM of all extremities" label="Extremities"Extremities FROM of all  extremities.  NEUROLOGICAL normal" label="Gait:"Gait: normal. alert and oriented x 3" label="Orientation:"Orientation: alert and oriented x 3.    Assessment: Assessment:  Postmenopausal bleeding - N95.0 (Primary)     Urinary frequency - R35.0     Plan: Treatment:  Postmenopausal bleeding  Notes: plan further evaluation via hysteroscopy D&C possible polypectomy  , . rb/a of surgery discussed with the patient including but not limited to infection bleeding perforation of the uterus with the need for further surgery. pt voiced understanding and desires to proceed.  Urinary frequency  GNF:AOZHYQMVHQ Dip w/reflex to micro if positive  ION:GEXBMWU, Urine Routine (132440)  Others  NUU:VOZDG microscopic (ECL)  Procedures:  Immunizations:  Therapeutic Injections:  Diagnostic Imaging:  Lab Reports:  UYQ:IHKVQ microscopic (ECL)  RBC/hpf -  <5/HPF  0-5 - /HPF  WBC/hpf -  <5/HPF  0-5 - /HPF  Epithelial cells -  Few A 0-5 - /HPF  Mucus -  Negative  Negative - /HPF  Bacteria -  Few A Negative - /HPF    [employer]works, support 10/09/2015 11:43:00 : This order was created by the Interface. Benjiman Sedgwick 10/09/2015 03:55:49 PM > will await culture.. urinalysis does not support dx of uti McCoy,Tiffany 10/09/2015 04:24:12 PM > pt aware  QVZ:DGLOVFIEPP Dip w/reflex to micro if positive  Specific Gravity -  1.015  1.010-1.030 -   pH -  6.5  5.0-8.0 -   Leukocyte Esterase -  TRACE A Negative -   Blood -  NEGATIVE  Negative - ERY/UL  Glucose -  NEGATIVE  Negative - g/dL  Nitrite -  NEGATIVE  Negative -   Protein -  NEGATIVE  Negative - mg/dL  Ketones -  NEGATIVE  Negative - mg/dL  Urine Bilirubin -  NEGATIVE  Negative -   Urobilinogen -  0.2  0.0-1.0 - mg/dL    Leemon Ayala 29/51/8841 03:55:49 PM > McCoy,Tiffany 10/09/2015 04:24:12 PM > pt aware  YSA:YTKZSWF, Urine Routine ([id number]) Normal  Urine Culture, Routine -  Final report  -     Namita Yearwood 10/10/2015 07:07:09 PM > McCoy,Tiffany 10/13/2015  10:47:40 AM > pt aware

## 2015-10-14 ENCOUNTER — Ambulatory Visit (HOSPITAL_COMMUNITY)
Admission: RE | Admit: 2015-10-14 | Discharge: 2015-10-14 | Disposition: A | Discharge status: Home / Self Care | Payer: Managed Care, Other (non HMO) | Source: Ambulatory Visit | Attending: Obstetrics and Gynecology | Admitting: Obstetrics and Gynecology

## 2015-10-14 ENCOUNTER — Encounter (HOSPITAL_COMMUNITY)
Admission: RE | Disposition: A | Discharge status: Home / Self Care | Payer: Self-pay | Source: Ambulatory Visit | Attending: Obstetrics and Gynecology

## 2015-10-14 ENCOUNTER — Ambulatory Visit (HOSPITAL_COMMUNITY): Payer: Managed Care, Other (non HMO) | Admitting: Anesthesiology

## 2015-10-14 ENCOUNTER — Encounter (HOSPITAL_COMMUNITY): Payer: Self-pay

## 2015-10-14 DIAGNOSIS — M1990 Unspecified osteoarthritis, unspecified site: Secondary | ICD-10-CM | POA: Diagnosis not present

## 2015-10-14 DIAGNOSIS — N950 Postmenopausal bleeding: Secondary | ICD-10-CM | POA: Diagnosis not present

## 2015-10-14 DIAGNOSIS — K219 Gastro-esophageal reflux disease without esophagitis: Secondary | ICD-10-CM | POA: Insufficient documentation

## 2015-10-14 DIAGNOSIS — F329 Major depressive disorder, single episode, unspecified: Secondary | ICD-10-CM | POA: Insufficient documentation

## 2015-10-14 DIAGNOSIS — F419 Anxiety disorder, unspecified: Secondary | ICD-10-CM | POA: Insufficient documentation

## 2015-10-14 DIAGNOSIS — K589 Irritable bowel syndrome without diarrhea: Secondary | ICD-10-CM | POA: Insufficient documentation

## 2015-10-14 DIAGNOSIS — N840 Polyp of corpus uteri: Secondary | ICD-10-CM | POA: Diagnosis not present

## 2015-10-14 HISTORY — DX: Anxiety disorder, unspecified: F41.9

## 2015-10-14 HISTORY — PX: DILATATION & CURETTAGE/HYSTEROSCOPY WITH MYOSURE: SHX6511

## 2015-10-14 LAB — CBC
HCT: 42.8 % (ref 36.0–46.0)
Hemoglobin: 14.5 g/dL (ref 12.0–15.0)
MCH: 31.1 pg (ref 26.0–34.0)
MCHC: 33.9 g/dL (ref 30.0–36.0)
MCV: 91.8 fL (ref 78.0–100.0)
Platelets: 239 10*3/uL (ref 150–400)
RBC: 4.66 MIL/uL (ref 3.87–5.11)
RDW: 14.7 % (ref 11.5–15.5)
WBC: 5.1 10*3/uL (ref 4.0–10.5)

## 2015-10-14 SURGERY — DILATATION & CURETTAGE/HYSTEROSCOPY WITH MYOSURE
Anesthesia: General

## 2015-10-14 MED ORDER — FENTANYL CITRATE (PF) 100 MCG/2ML IJ SOLN
25.0000 ug | INTRAMUSCULAR | Status: DC | PRN
  Administered 2015-10-14: 25 ug via INTRAVENOUS

## 2015-10-14 MED ORDER — LIDOCAINE HCL (CARDIAC) 20 MG/ML IV SOLN
INTRAVENOUS | Status: AC
  Filled 2015-10-14: qty 5

## 2015-10-14 MED ORDER — KETOROLAC TROMETHAMINE 30 MG/ML IJ SOLN
INTRAMUSCULAR | Status: DC | PRN
  Administered 2015-10-14: 30 mg via INTRAVENOUS

## 2015-10-14 MED ORDER — PROMETHAZINE HCL 25 MG/ML IJ SOLN: 6.2500 mg | INTRAMUSCULAR | Status: DC | PRN

## 2015-10-14 MED ORDER — MIDAZOLAM HCL 2 MG/2ML IJ SOLN
INTRAMUSCULAR | Status: AC
  Filled 2015-10-14: qty 2

## 2015-10-14 MED ORDER — FENTANYL CITRATE (PF) 100 MCG/2ML IJ SOLN
INTRAMUSCULAR | Status: AC
  Administered 2015-10-14: 25 ug via INTRAVENOUS
  Filled 2015-10-14: qty 2

## 2015-10-14 MED ORDER — LIDOCAINE HCL (CARDIAC) 20 MG/ML IV SOLN
INTRAVENOUS | Status: DC | PRN
  Administered 2015-10-14: 100 mg via INTRAVENOUS

## 2015-10-14 MED ORDER — DEXAMETHASONE SODIUM PHOSPHATE 10 MG/ML IJ SOLN
INTRAMUSCULAR | Status: DC | PRN
  Administered 2015-10-14: 4 mg via INTRAVENOUS

## 2015-10-14 MED ORDER — GLYCOPYRROLATE 0.2 MG/ML IJ SOLN
INTRAMUSCULAR | Status: DC | PRN
  Administered 2015-10-14 (×2): 0.1 mg via INTRAVENOUS

## 2015-10-14 MED ORDER — FENTANYL CITRATE (PF) 100 MCG/2ML IJ SOLN
INTRAMUSCULAR | Status: DC | PRN
  Administered 2015-10-14 (×2): 50 ug via INTRAVENOUS

## 2015-10-14 MED ORDER — SODIUM CHLORIDE 0.9 % IR SOLN
Status: DC | PRN
  Administered 2015-10-14: 3000 mL

## 2015-10-14 MED ORDER — FENTANYL CITRATE (PF) 100 MCG/2ML IJ SOLN
INTRAMUSCULAR | Status: AC
  Filled 2015-10-14: qty 2

## 2015-10-14 MED ORDER — ONDANSETRON HCL 4 MG/2ML IJ SOLN
INTRAMUSCULAR | Status: DC | PRN
  Administered 2015-10-14: 4 mg via INTRAVENOUS

## 2015-10-14 MED ORDER — DEXAMETHASONE SODIUM PHOSPHATE 4 MG/ML IJ SOLN
INTRAMUSCULAR | Status: AC
  Filled 2015-10-14: qty 1

## 2015-10-14 MED ORDER — PROPOFOL 10 MG/ML IV BOLUS
INTRAVENOUS | Status: DC | PRN
  Administered 2015-10-14: 150 mg via INTRAVENOUS

## 2015-10-14 MED ORDER — PROPOFOL 10 MG/ML IV BOLUS
INTRAVENOUS | Status: AC
  Filled 2015-10-14: qty 20

## 2015-10-14 MED ORDER — IBUPROFEN 600 MG PO TABS: ORAL_TABLET | ORAL | 1 refills | Status: AC

## 2015-10-14 MED ORDER — MIDAZOLAM HCL 2 MG/2ML IJ SOLN
INTRAMUSCULAR | Status: DC | PRN
  Administered 2015-10-14: 1 mg via INTRAVENOUS

## 2015-10-14 MED ORDER — SCOPOLAMINE 1 MG/3DAYS TD PT72
1.0000 | MEDICATED_PATCH | Freq: Once | TRANSDERMAL | Status: DC
  Administered 2015-10-14: 1.5 mg via TRANSDERMAL

## 2015-10-14 MED ORDER — SCOPOLAMINE 1 MG/3DAYS TD PT72
MEDICATED_PATCH | TRANSDERMAL | Status: AC
  Administered 2015-10-14: 1.5 mg via TRANSDERMAL
  Filled 2015-10-14: qty 1

## 2015-10-14 MED ORDER — LACTATED RINGERS IV SOLN
INTRAVENOUS | Status: DC
  Administered 2015-10-14 (×2): via INTRAVENOUS

## 2015-10-14 MED ORDER — BUPIVACAINE HCL (PF) 0.25 % IJ SOLN
INTRAMUSCULAR | Status: AC
  Filled 2015-10-14: qty 30

## 2015-10-14 MED ORDER — ONDANSETRON HCL 4 MG/2ML IJ SOLN
INTRAMUSCULAR | Status: AC
  Filled 2015-10-14: qty 2

## 2015-10-14 MED ORDER — KETOROLAC TROMETHAMINE 30 MG/ML IJ SOLN
INTRAMUSCULAR | Status: AC
  Filled 2015-10-14: qty 1

## 2015-10-14 MED ORDER — BUPIVACAINE HCL (PF) 0.25 % IJ SOLN
INTRAMUSCULAR | Status: DC | PRN
  Administered 2015-10-14: 14 mL

## 2015-10-14 MED ORDER — HYDROCODONE-ACETAMINOPHEN 5-325 MG PO TABS: 1.0000 | ORAL_TABLET | Freq: Four times a day (QID) | ORAL | 0 refills | Status: DC | PRN

## 2015-10-14 SURGICAL SUPPLY — 22 items
CANISTER SUCT 3000ML (MISCELLANEOUS) ×3 IMPLANT
CATH ROBINSON RED A/P 16FR (CATHETERS) ×3 IMPLANT
CLOTH BEACON ORANGE TIMEOUT ST (SAFETY) ×3 IMPLANT
CONTAINER PREFILL 10% NBF 60ML (FORM) ×6 IMPLANT
DEVICE MYOSURE LITE (MISCELLANEOUS) IMPLANT
DEVICE MYOSURE REACH (MISCELLANEOUS) IMPLANT
ELECT REM PT RETURN 9FT ADLT (ELECTROSURGICAL) ×3
ELECTRODE REM PT RTRN 9FT ADLT (ELECTROSURGICAL) ×1 IMPLANT
FILTER ARTHROSCOPY CONVERTOR (FILTER) ×3 IMPLANT
GLOVE BIOGEL M 6.5 STRL (GLOVE) ×6 IMPLANT
GLOVE BIOGEL PI IND STRL 6.5 (GLOVE) ×1 IMPLANT
GLOVE BIOGEL PI IND STRL 7.0 (GLOVE) ×1 IMPLANT
GLOVE BIOGEL PI INDICATOR 6.5 (GLOVE) ×2
GLOVE BIOGEL PI INDICATOR 7.0 (GLOVE) ×2
GOWN STRL REUS W/TWL LRG LVL3 (GOWN DISPOSABLE) ×6 IMPLANT
PACK VAGINAL MINOR WOMEN LF (CUSTOM PROCEDURE TRAY) ×3 IMPLANT
PAD OB MATERNITY 4.3X12.25 (PERSONAL CARE ITEMS) ×3 IMPLANT
SEAL ROD LENS SCOPE MYOSURE (ABLATOR) ×3 IMPLANT
TOWEL OR 17X24 6PK STRL BLUE (TOWEL DISPOSABLE) ×6 IMPLANT
TUBING AQUILEX INFLOW (TUBING) ×3 IMPLANT
TUBING AQUILEX OUTFLOW (TUBING) ×3 IMPLANT
WATER STERILE IRR 1000ML POUR (IV SOLUTION) ×3 IMPLANT

## 2015-10-14 NOTE — H&P (Signed)
Date of Initial H&P: 10/13/2015  History reviewed, patient examined, no change in status, stable for surgery.

## 2015-10-14 NOTE — Anesthesia Postprocedure Evaluation (Signed)
Anesthesia Post Note  Patient: Jenny Murdick  Procedure(s) Performed: Procedure(s) (LRB): DILATATION & CURETTAGE/HYSTEROSCOPY WITH MYOSURE (N/A)  Patient location during evaluation: PACU Anesthesia Type: General Level of consciousness: awake and alert Pain management: pain level controlled Vital Signs Assessment: post-procedure vital signs reviewed and stable Respiratory status: spontaneous breathing, nonlabored ventilation, respiratory function stable and patient connected to nasal cannula oxygen Cardiovascular status: blood pressure returned to baseline and stable Postop Assessment: no signs of nausea or vomiting Anesthetic complications: no     Last Vitals:  Vitals:   10/14/15 1200 10/14/15 1215  BP: 114/65 119/71  Pulse: 63 62  Resp: 16 11  Temp:      Last Pain:  Vitals:   10/14/15 1200  TempSrc:   PainSc: 1    Pain Goal: Patients Stated Pain Goal: 3 (10/14/15 1200)               Cecile Hearing

## 2015-10-14 NOTE — Anesthesia Preprocedure Evaluation (Addendum)
Anesthesia Evaluation  Patient identified by MRN, date of birth, ID band Patient awake    Reviewed: Allergy & Precautions, NPO status , Patient's Chart, lab work & pertinent test results  Airway Mallampati: III  TM Distance: >3 FB Neck ROM: Full    Dental  (+) Teeth Intact, Dental Advisory Given   Pulmonary neg pulmonary ROS,    Pulmonary exam normal breath sounds clear to auscultation       Cardiovascular Exercise Tolerance: Good negative cardio ROS Normal cardiovascular exam Rhythm:Regular Rate:Normal     Neuro/Psych  Headaches, PSYCHIATRIC DISORDERS Anxiety Depression    GI/Hepatic Neg liver ROS, GERD  Medicated and Controlled,IBS   Endo/Other  negative endocrine ROSObesity   Renal/GU negative Renal ROS     Musculoskeletal  (+) Arthritis ,   Abdominal   Peds  Hematology negative hematology ROS (+)   Anesthesia Other Findings Day of surgery medications reviewed with the patient.  Reproductive/Obstetrics                           Anesthesia Physical Anesthesia Plan  ASA: II  Anesthesia Plan: General   Post-op Pain Management:    Induction: Intravenous  Airway Management Planned: LMA  Additional Equipment:   Intra-op Plan:   Post-operative Plan: Extubation in OR  Informed Consent: I have reviewed the patients History and Physical, chart, labs and discussed the procedure including the risks, benefits and alternatives for the proposed anesthesia with the patient or authorized representative who has indicated his/her understanding and acceptance.   Dental advisory given  Plan Discussed with: CRNA  Anesthesia Plan Comments: (Risks/benefits of general anesthesia discussed with patient including risk of damage to teeth, lips, gum, and tongue, nausea/vomiting, allergic reactions to medications, and the possibility of heart attack, stroke and death.  All patient questions answered.   Patient wishes to proceed.)       Anesthesia Quick Evaluation

## 2015-10-14 NOTE — Transfer of Care (Signed)
Immediate Anesthesia Transfer of Care Note  Patient: Kathy Oneill  Procedure(s) Performed: Procedure(s): DILATATION & CURETTAGE/HYSTEROSCOPY WITH MYOSURE (N/A)  Patient Location: PACU  Anesthesia Type:General  Level of Consciousness: awake, alert , oriented and patient cooperative  Airway & Oxygen Therapy: Patient Spontanous Breathing and Patient connected to nasal cannula oxygen  Post-op Assessment: Report given to RN and Post -op Vital signs reviewed and stable  Post vital signs: Reviewed and stable  Last Vitals:  Vitals:   10/14/15 0943  BP: (!) 125/101  Pulse: 72  Resp: 18  Temp: 36.7 C    Last Pain:  Vitals:   10/14/15 0943  TempSrc: Oral      Patients Stated Pain Goal: 3 (10/14/15 0943)  Complications: No apparent anesthesia complications

## 2015-10-14 NOTE — Op Note (Signed)
10/14/2015  11:15 AM  PATIENT:  Kathy Oneill  60 y.o. female  PRE-OPERATIVE DIAGNOSIS:  POST MENOPAUSAL BLEEDING   POST-OPERATIVE DIAGNOSIS:  POST MENOPAUSAL BLEEDING   PROCEDURE:  Procedure(s): DILATATION & CURETTAGE/HYSTEROSCOPY WITH MYOSURE (N/A)  SURGEON:  Surgeon(s) and Role:    * Gerald Leitz, MD - Primary  PHYSICIAN ASSISTANT:   ASSISTANTS: none   ANESTHESIA:   general  EBL:  Total I/O In: 1000 [I.V.:1000] Out: 70 [Urine:60; Blood:10]  BLOOD ADMINISTERED:none  DRAINS: none   LOCAL MEDICATIONS USED:  MARCAINE     SPECIMEN:  Endometrial currettings possible endometrial polyp   DISPOSITION OF SPECIMEN:  PATHOLOGY  COUNTS:  YES  TOURNIQUET:  * No tourniquets in log *  DICTATION: .Dragon Dictation  PLAN OF CARE: Discharge to home after PACU  PATIENT DISPOSITION:  PACU - hemodynamically stable.   Delay start of Pharmacological VTE agent (>24hrs) due to surgical blood loss or risk of bleeding: not applicable.  Findings: normal appearing cervix and vagina. Proliferative endometrium noted. Possible small polyp vs proliferative endometrium   Procedure: Patient was taken to the operating room where she was placed under general anesthesia.  Time out was performed. She was placed in the dorsal lithotomy position. She was prepped and draped in the usual sterile fashion. A speculum was placed into the vaginal vault. The anterior lip of the cervix was grasped with a single-tooth tenaculum. Quarter percent Marcaine was injected at the 4 and 8:00 positions of the cervix. The cervix was then sounded to 9 cm. The cervix was dilated to approximately 6 mm. myosure  hysteroscope was inserted. The findings noted above. The hysteroscope was removed.  A sharp curette was introduced. And currettings were obtained. The hysteroscope was reinserted there was questionable polypoid tissue on the Right side wall of the endometrium.  The polyp was resected with  myosure lite blade. here  was no evidence of perforation. Hysteroscope was then removed. The single-tooth tenaculum was removed from the anterior lip of the cervix. Slight bleeding was noted from the tenaculum site. This was controlled with silver nitrate.  Excellent hemostasis was noted. The speculum was removed from the patient's vagina. She was awakened from anesthesia taken care to the recovery room awake and in stable condition. Sponge lap and needle counts were correct x2.  Saline  deficit was 400 cc.

## 2015-10-14 NOTE — Anesthesia Procedure Notes (Signed)
Procedure Name: LMA Insertion Date/Time: 10/14/2015 10:39 AM Performed by: Earmon Phoenix Pre-anesthesia Checklist: Patient identified, Emergency Drugs available, Suction available, Timeout performed and Patient being monitored Patient Re-evaluated:Patient Re-evaluated prior to inductionOxygen Delivery Method: Circle system utilized Preoxygenation: Pre-oxygenation with 100% oxygen Intubation Type: IV induction LMA: LMA inserted LMA Size: 4.0 Number of attempts: 1 Placement Confirmation: positive ETCO2,  CO2 detector and breath sounds checked- equal and bilateral Tube secured with: Tape Dental Injury: Teeth and Oropharynx as per pre-operative assessment

## 2015-10-19 ENCOUNTER — Encounter (HOSPITAL_COMMUNITY): Payer: Self-pay | Admitting: Obstetrics and Gynecology

## 2015-12-23 ENCOUNTER — Ambulatory Visit: Payer: Managed Care, Other (non HMO) | Admitting: Podiatry

## 2016-01-05 DIAGNOSIS — M79642 Pain in left hand: Secondary | ICD-10-CM | POA: Insufficient documentation

## 2016-01-05 DIAGNOSIS — M79641 Pain in right hand: Secondary | ICD-10-CM | POA: Insufficient documentation

## 2016-01-05 DIAGNOSIS — G8929 Other chronic pain: Secondary | ICD-10-CM | POA: Insufficient documentation

## 2016-01-12 DIAGNOSIS — M7701 Medial epicondylitis, right elbow: Secondary | ICD-10-CM | POA: Insufficient documentation

## 2016-01-21 ENCOUNTER — Other Ambulatory Visit: Payer: Self-pay | Admitting: Obstetrics and Gynecology

## 2016-01-21 ENCOUNTER — Other Ambulatory Visit (HOSPITAL_COMMUNITY)
Admission: RE | Admit: 2016-01-21 | Discharge: 2016-01-21 | Disposition: A | Discharge status: Home / Self Care | Payer: Managed Care, Other (non HMO) | Source: Ambulatory Visit | Attending: Obstetrics and Gynecology | Admitting: Obstetrics and Gynecology

## 2016-01-21 DIAGNOSIS — Z01419 Encounter for gynecological examination (general) (routine) without abnormal findings: Secondary | ICD-10-CM | POA: Diagnosis present

## 2016-01-21 DIAGNOSIS — Z1151 Encounter for screening for human papillomavirus (HPV): Secondary | ICD-10-CM | POA: Insufficient documentation

## 2016-01-25 LAB — CYTOLOGY - PAP
Diagnosis: NEGATIVE
HPV: NOT DETECTED

## 2016-09-20 ENCOUNTER — Other Ambulatory Visit: Payer: Self-pay | Admitting: Obstetrics and Gynecology

## 2016-09-20 DIAGNOSIS — Z1231 Encounter for screening mammogram for malignant neoplasm of breast: Secondary | ICD-10-CM

## 2016-09-29 ENCOUNTER — Ambulatory Visit
Admission: RE | Admit: 2016-09-29 | Discharge: 2016-09-29 | Disposition: A | Discharge status: Home / Self Care | Payer: Managed Care, Other (non HMO) | Source: Ambulatory Visit | Attending: Obstetrics and Gynecology | Admitting: Obstetrics and Gynecology

## 2016-09-29 DIAGNOSIS — Z1231 Encounter for screening mammogram for malignant neoplasm of breast: Secondary | ICD-10-CM

## 2017-09-12 ENCOUNTER — Other Ambulatory Visit: Payer: Self-pay | Admitting: Physician Assistant

## 2017-09-12 DIAGNOSIS — R140 Abdominal distension (gaseous): Secondary | ICD-10-CM

## 2017-09-13 ENCOUNTER — Ambulatory Visit
Admission: RE | Admit: 2017-09-13 | Discharge: 2017-09-13 | Disposition: A | Discharge status: Home / Self Care | Payer: Managed Care, Other (non HMO) | Source: Ambulatory Visit | Attending: Physician Assistant | Admitting: Physician Assistant

## 2017-09-13 DIAGNOSIS — R140 Abdominal distension (gaseous): Secondary | ICD-10-CM

## 2018-02-12 ENCOUNTER — Other Ambulatory Visit: Payer: Self-pay | Admitting: Obstetrics and Gynecology

## 2018-02-12 DIAGNOSIS — Z1231 Encounter for screening mammogram for malignant neoplasm of breast: Secondary | ICD-10-CM

## 2018-04-03 ENCOUNTER — Ambulatory Visit
Admission: RE | Admit: 2018-04-03 | Discharge: 2018-04-03 | Disposition: A | Discharge status: Home / Self Care | Payer: Managed Care, Other (non HMO) | Source: Ambulatory Visit | Attending: Obstetrics and Gynecology | Admitting: Obstetrics and Gynecology

## 2018-04-03 DIAGNOSIS — Z1231 Encounter for screening mammogram for malignant neoplasm of breast: Secondary | ICD-10-CM

## 2018-04-18 DIAGNOSIS — Z860101 Personal history of adenomatous and serrated colon polyps: Secondary | ICD-10-CM | POA: Insufficient documentation

## 2018-04-18 DIAGNOSIS — Z86010 Personal history of colonic polyps: Secondary | ICD-10-CM | POA: Insufficient documentation

## 2018-05-15 ENCOUNTER — Other Ambulatory Visit: Payer: Self-pay | Admitting: Obstetrics and Gynecology

## 2018-05-15 ENCOUNTER — Other Ambulatory Visit (HOSPITAL_COMMUNITY)
Admission: RE | Admit: 2018-05-15 | Discharge: 2018-05-15 | Disposition: A | Discharge status: Home / Self Care | Payer: BLUE CROSS/BLUE SHIELD | Source: Ambulatory Visit | Attending: Obstetrics and Gynecology | Admitting: Obstetrics and Gynecology

## 2018-05-15 DIAGNOSIS — Z01419 Encounter for gynecological examination (general) (routine) without abnormal findings: Secondary | ICD-10-CM | POA: Diagnosis not present

## 2018-05-18 LAB — CYTOLOGY - PAP
Diagnosis: NEGATIVE
HPV: NOT DETECTED

## 2018-05-22 ENCOUNTER — Other Ambulatory Visit: Payer: Self-pay

## 2018-05-22 ENCOUNTER — Ambulatory Visit (INDEPENDENT_AMBULATORY_CARE_PROVIDER_SITE_OTHER): Payer: BLUE CROSS/BLUE SHIELD | Admitting: Allergy and Immunology

## 2018-05-22 ENCOUNTER — Encounter: Payer: Self-pay | Admitting: Allergy and Immunology

## 2018-05-22 VITALS — BP 132/84 | HR 74 | Temp 98.0°F | Resp 16 | Ht 66.5 in | Wt 248.0 lb

## 2018-05-22 DIAGNOSIS — J3089 Other allergic rhinitis: Secondary | ICD-10-CM | POA: Diagnosis not present

## 2018-05-22 DIAGNOSIS — T783XXD Angioneurotic edema, subsequent encounter: Secondary | ICD-10-CM

## 2018-05-22 DIAGNOSIS — G478 Other sleep disorders: Secondary | ICD-10-CM | POA: Diagnosis not present

## 2018-05-22 DIAGNOSIS — L308 Other specified dermatitis: Secondary | ICD-10-CM | POA: Diagnosis not present

## 2018-05-22 DIAGNOSIS — J301 Allergic rhinitis due to pollen: Secondary | ICD-10-CM | POA: Diagnosis not present

## 2018-05-22 DIAGNOSIS — R5383 Other fatigue: Secondary | ICD-10-CM | POA: Diagnosis not present

## 2018-05-22 DIAGNOSIS — L989 Disorder of the skin and subcutaneous tissue, unspecified: Secondary | ICD-10-CM

## 2018-05-22 MED ORDER — MOMETASONE FUROATE 0.1 % EX OINT: TOPICAL_OINTMENT | CUTANEOUS | 1 refills | Status: AC

## 2018-05-22 NOTE — Patient Instructions (Addendum)
  1.  Blood - CBC w/diff, CMP, TSH, T4, TP, alpha-gal panel, C4  2.  EpiPen, Benadryl, MD/ER evaluation for allergic reaction  3.  Slowly consolidate all caffeine consumption  4.  Can continue antihistamine if needed  5.  Can use mometasone 0.1% ointment applied to rash daily until resolved  6.  Further evaluation?  Yes, if recurrent swelling

## 2018-05-22 NOTE — Progress Notes (Signed)
Dear Dr. Cliffton Asters,  Thank you for referring Kathy Oneill to the [facility] [place] on 05/22/2018.   Below is a summation of this patient's evaluation and recommendations.  Thank you for your referral. I will keep you informed about this patient's response to treatment.   If you have any questions please do not hesitate to contact me.   Sincerely,  Jessica Priest, MD Allergy / Immunology [facility]   ______________________________________________________________________    NEW PATIENT NOTE  Referring Provider: Laurann Montana, MD Primary Provider: Laurann Montana, MD Date of office visit: 05/22/2018    Subjective:   Chief Complaint:  Kathy Oneill (DOB: [birth year]) is a 63 y.o. female who presents to the clinic on 05/22/2018 with a chief complaint of Angioedema and Gastroesophageal Reflux .     HPI: Kathy Oneill presents to this clinic in evaluation of several issues.  I had seen her in this clinic over 5 years ago for issues with allergic respiratory tract disease.  Kathy Oneill believes that the issue with her atopic respiratory disease is under excellent control at this point in time and she has not required a systemic steroid or antibiotic for any type of respiratory tract issue in years and occasionally utilizes antihistamines in the treatment of this condition.  What is have more concern to her at this point in time is the fact that she developed a swelling reaction on 13 April 2018 involving her left lip and left cheek for which she went to the urgent care center and was treated with a systemic steroid.  She has not had recurrence of this issue since that point.  There was no associated systemic or constitutional symptoms and there was no obvious trigger.  She did have a history about 8 years ago of developing issues with lip and cheek swelling and apparently has had evaluation for  this condition including check of alpha gal at that point which did not identify any significant abnormality.  As well, she has complaints about developing a recurrent shin dermatitis over the course of the past 5 years.  She has seen a dermatologist for this issue and apparently response to the use of topical steroids intermittently.  Her dermatitis is erupted over the course of the past 2 days on her left shin.  There is also an issue with fatigue.  She cannot really say when her fatigue started but it was sometime in 2019.  She has a very poor quality of sleep.  She has difficulty maintaining sleep and has used Ativan and Benadryl in the past but she feels somewhat hung over when utilizing these medications.  She usually wakes up around 3:00 in the morning and has difficulty going to sleep because her mind is "racing".  On those days in which she cannot maintain sleep she will wake up tired and occasionally she will take a nap on a Saturday.  She does drink 2 cups of coffee every morning and has 2 iced teas every day and has alcohol about every other day with the consumption of wine.  Past Medical History:  Diagnosis Date  . Angio-edema   . Anxiety   . Arthritis    hands feet  . Back pain    L2-L3  . DDD (degenerative disc disease), lumbar   . Depression    history - no meds currently  . Endometrial polyp   . GERD (gastroesophageal reflux disease)   .  Headache(784.0)    otc meds prn  . Hx of seasonal allergies   . IBS (irritable bowel syndrome)   . Lichen planus   . OA (osteoarthritis)   . Premenstrual syndrome   . SVD (spontaneous vaginal delivery)    x 2    Past Surgical History:  Procedure Laterality Date  . ANTERIOR CRUCIATE LIGAMENT REPAIR  2015   replaced with donor  . CHOLECYSTECTOMY    . COLONOSCOPY    . DILATATION & CURETTAGE/HYSTEROSCOPY WITH MYOSURE N/A 10/14/2015   Procedure: DILATATION & CURETTAGE/HYSTEROSCOPY WITH MYOSURE;  Surgeon: Gerald Leitz, MD;  Location: [facility]  ORS;  Service: Gynecology;  Laterality: N/A;  . DILATATION & CURRETTAGE/HYSTEROSCOPY WITH RESECTOCOPE N/A 07/12/2012   Procedure: DILATATION & CURETTAGE/HYSTEROSCOPY WITH RESECTOCOPE;  Surgeon: Dorien Chihuahua. Richardson Dopp, MD;  Location: [facility] ORS;  Service: Gynecology;  Laterality: N/A;  . DILATION AND CURETTAGE OF UTERUS     Dr. Thomasena Edis - polypectomy -   . TONSILLECTOMY    . TUBAL LIGATION    . WISDOM TOOTH EXTRACTION      Allergies as of 05/22/2018      Reactions   Codeine Nausea And Vomiting      Medication List      ALREX 0.2 % Susp Generic drug:  loteprednol 1 drop 2 (two) times daily as needed (for watery eyes).   BENADRYL ALLERGY 25 MG tablet Generic drug:  diphenhydrAMINE Take by mouth.   calcium carbonate 600 MG Tabs tablet Commonly known as:  OS-CAL Take 1,200 mg by mouth daily.   EPINEPHrine 0.3 mg/0.3 mL Devi Commonly known as:  EPI-PEN Inject 0.3 mg into the muscle once.   ibuprofen 600 MG tablet Commonly known as:  ADVIL,MOTRIN 1 po every 6 hours as needed for pain   loratadine-pseudoephedrine 5-120 MG tablet Commonly known as:  CLARITIN-D 12-hour Take by mouth.   LORazepam 0.5 MG tablet Commonly known as:  ATIVAN Take 0.5 mg by mouth at bedtime as needed for sleep.       Review of systems negative except as noted in HPI / PMHx or noted below:  Review of Systems  Constitutional: Negative.   HENT: Negative.   Eyes: Negative.   Respiratory: Negative.   Cardiovascular: Negative.   Gastrointestinal: Negative.   Genitourinary: Negative.   Musculoskeletal: Negative.   Skin: Negative.   Neurological: Negative.   Endo/Heme/Allergies: Negative.   Psychiatric/Behavioral: Negative.     Family History  Problem Relation Age of Onset  . Cancer Mother        urine ca  . Breast cancer Sister     Social History   Socioeconomic History  . Marital status: Married    Spouse name: Not on file  . Number of children: Not on file  . Years of education: Not on file  .  Highest education level: Not on file  Occupational History  . Not on file  Social Needs  . Financial resource strain: Not on file  . Food insecurity:    Worry: Not on file    Inability: Not on file  . Transportation needs:    Medical: Not on file    Non-medical: Not on file  Tobacco Use  . Smoking status: Never Smoker  . Smokeless tobacco: Never Used  Substance and Sexual Activity  . Alcohol use: Yes    Alcohol/week: 2.0 standard drinks    Types: 2 Shots of liquor per week    Comment: socially mixed drinks  . Drug use: No  .  Sexual activity: Yes    Birth control/protection: Surgical  Lifestyle  . Physical activity:    Days per week: Not on file    Minutes per session: Not on file  . Stress: Not on file  Relationships  . Social connections:    Talks on phone: Not on file    Gets together: Not on file    Attends religious service: Not on file    Active member of club or organization: Not on file    Attends meetings of clubs or organizations: Not on file    Relationship status: Not on file  . Intimate partner violence:    Fear of current or ex partner: Not on file    Emotionally abused: Not on file    Physically abused: Not on file    Forced sexual activity: Not on file  Other Topics Concern  . Not on file  Social History Narrative  . Not on file    Environmental and Social history  Lives in a house with a dry environment, 3 dogs located inside the household, carpet in the bedroom, plastic on the bed, plastic on the pillow, no smokers located inside the household.  She works in an office setting.  Objective:   Vitals:   05/22/18 0907  BP: 132/84  Pulse: 74  Resp: 16  Temp: 98 F (36.7 C)  SpO2: 94%   Height: 5' 6.5" (168.9 cm) Weight: 248 lb (112.5 kg)  Physical Exam Constitutional:      Appearance: She is not diaphoretic.  HENT:     Head: Normocephalic. No right periorbital erythema or left periorbital erythema.     Right Ear: Tympanic membrane, ear  canal and external ear normal.     Left Ear: Tympanic membrane, ear canal and external ear normal.     Nose: Nose normal. No mucosal edema or rhinorrhea.     Mouth/Throat:     Pharynx: No oropharyngeal exudate.  Eyes:     General: Lids are normal.     Conjunctiva/sclera: Conjunctivae normal.     Pupils: Pupils are equal, round, and reactive to light.  Neck:     Thyroid: No thyromegaly.     Trachea: Trachea normal. No tracheal deviation.  Cardiovascular:     Rate and Rhythm: Normal rate and regular rhythm.     Heart sounds: S1 normal and S2 normal. Murmur (Systolic) present.  Pulmonary:     Effort: Pulmonary effort is normal. No respiratory distress.     Breath sounds: No stridor. No wheezing or rales.  Chest:     Chest wall: No tenderness.  Abdominal:     General: There is no distension.     Palpations: Abdomen is soft. There is no mass.     Tenderness: There is no abdominal tenderness. There is no guarding or rebound.  Musculoskeletal:        General: No tenderness.  Lymphadenopathy:     Head:     Right side of head: No tonsillar adenopathy.     Left side of head: No tonsillar adenopathy.     Cervical: No cervical adenopathy.  Skin:    Coloration: Skin is not pale.     Findings: Rash (Erythematous slightly indurated 5 cm area anterior left shin) present. No erythema.     Nails: There is no clubbing.   Neurological:     Mental Status: She is alert.     Diagnostics: Allergy skin tests were not performed.   Assessment and Plan:  1. Perennial allergic rhinitis   2. Seasonal allergic rhinitis due to pollen   3. Inflammatory dermatosis   4. Angioedema, subsequent encounter   5. Other fatigue   6. Sleep dysfunction with sleep stage disturbance     1.  Blood - CBC w/diff, CMP, TSH, T4, TP, alpha-gal panel, C4  2.  EpiPen, Benadryl, MD/ER evaluation for allergic reaction  3.  Slowly consolidate all caffeine consumption  4.  Can continue antihistamine if  needed  5.  Can use mometasone 0.1% ointment applied to rash daily until resolved  6.  Further evaluation?  Yes, if recurrent swelling  Kathy Oneill will have blood test performed in investigation of her systemic disease contributing to her immunological hyperreactivity reaction.  At this point we will not assign any specific therapy for this issue and hopefully this will not be a recurrent phenomena.  There are some other issues that need to be addressed as well including her fatigue and her sleep dysfunction and we will have her consolidate all of her caffeine consumption as much as possible and also obtain some blood tests in investigation of these issues.  She also has an inflammatory dermatosis that hopefully will respond to short-term use of a topical steroid.  I will see her back in this clinic pending her response to therapy and the results of her diagnostic testing.  Jessica Priest, MD Allergy / Immunology [facility] [place]

## 2018-05-23 ENCOUNTER — Encounter: Payer: Self-pay | Admitting: Allergy and Immunology

## 2018-05-25 LAB — COMPREHENSIVE METABOLIC PANEL
ALT: 15 IU/L (ref 0–32)
AST: 16 IU/L (ref 0–40)
Albumin/Globulin Ratio: 2 (ref 1.2–2.2)
Albumin: 4.5 g/dL (ref 3.8–4.9)
Alkaline Phosphatase: 78 IU/L (ref 39–117)
BUN/Creatinine Ratio: 18 (ref 12–28)
BUN: 13 mg/dL (ref 8–27)
Bilirubin Total: 0.4 mg/dL (ref 0.0–1.2)
CO2: 24 mmol/L (ref 20–29)
Calcium: 9.4 mg/dL (ref 8.7–10.3)
Chloride: 103 mmol/L (ref 96–106)
Creatinine, Ser: 0.72 mg/dL (ref 0.57–1.00)
GFR calc Af Amer: 105 mL/min/{1.73_m2} (ref >59)
GFR calc non Af Amer: 91 mL/min/{1.73_m2} (ref >59)
Globulin, Total: 2.3 g/dL (ref 1.5–4.5)
Glucose: 95 mg/dL (ref 65–99)
Potassium: 4.3 mmol/L (ref 3.5–5.2)
Sodium: 144 mmol/L (ref 134–144)
Total Protein: 6.8 g/dL (ref 6.0–8.5)

## 2018-05-25 LAB — THYROID PEROXIDASE ANTIBODY: Thyroperoxidase Ab SerPl-aCnc: 14 IU/mL (ref 0–34)

## 2018-05-25 LAB — ALPHA-GAL PANEL
Alpha Gal IgE*: <0.1 kU/L (ref <0.10)
Beef (Bos spp) IgE: <0.1 kU/L (ref <0.35)
Class Interpretation: 0
Class Interpretation: 0
Class Interpretation: 0
Lamb/Mutton (Ovis spp) IgE: <0.1 kU/L (ref <0.35)
Pork (Sus spp) IgE: <0.1 kU/L (ref <0.35)

## 2018-05-25 LAB — CBC WITH DIFFERENTIAL/PLATELET
Basophils Absolute: 0 10*3/uL (ref 0.0–0.2)
Basos: 1 %
EOS (ABSOLUTE): 0.2 10*3/uL (ref 0.0–0.4)
Eos: 3 %
Hematocrit: 42.1 % (ref 34.0–46.6)
Hemoglobin: 14.3 g/dL (ref 11.1–15.9)
Immature Grans (Abs): 0 10*3/uL (ref 0.0–0.1)
Immature Granulocytes: 0 %
Lymphocytes Absolute: 1.3 10*3/uL (ref 0.7–3.1)
Lymphs: 27 %
MCH: 30.6 pg (ref 26.6–33.0)
MCHC: 34 g/dL (ref 31.5–35.7)
MCV: 90 fL (ref 79–97)
Monocytes Absolute: 0.5 10*3/uL (ref 0.1–0.9)
Monocytes: 10 %
Neutrophils Absolute: 3 10*3/uL (ref 1.4–7.0)
Neutrophils: 59 %
Platelets: 293 10*3/uL (ref 150–450)
RBC: 4.68 x10E6/uL (ref 3.77–5.28)
RDW: 13 % (ref 11.7–15.4)
WBC: 5 10*3/uL (ref 3.4–10.8)

## 2018-05-25 LAB — TSH: TSH: 2.32 u[IU]/mL (ref 0.450–4.500)

## 2018-05-25 LAB — C4 COMPLEMENT: Complement C4, Serum: 34 mg/dL (ref 14–44)

## 2018-05-25 LAB — T4, FREE: Free T4: 1.18 ng/dL (ref 0.82–1.77)

## 2018-06-12 DIAGNOSIS — R05 Cough: Secondary | ICD-10-CM | POA: Diagnosis not present

## 2018-08-30 DIAGNOSIS — H16201 Unspecified keratoconjunctivitis, right eye: Secondary | ICD-10-CM | POA: Diagnosis not present

## 2018-12-19 DIAGNOSIS — Z01419 Encounter for gynecological examination (general) (routine) without abnormal findings: Secondary | ICD-10-CM | POA: Diagnosis not present

## 2018-12-19 DIAGNOSIS — E7800 Pure hypercholesterolemia, unspecified: Secondary | ICD-10-CM | POA: Diagnosis not present

## 2018-12-20 DIAGNOSIS — R002 Palpitations: Secondary | ICD-10-CM | POA: Diagnosis not present

## 2018-12-20 DIAGNOSIS — F419 Anxiety disorder, unspecified: Secondary | ICD-10-CM | POA: Diagnosis not present

## 2019-01-12 ENCOUNTER — Other Ambulatory Visit: Payer: Self-pay

## 2019-01-12 DIAGNOSIS — Z20822 Contact with and (suspected) exposure to covid-19: Secondary | ICD-10-CM

## 2019-01-13 LAB — NOVEL CORONAVIRUS, NAA: SARS-CoV-2, NAA: NOT DETECTED

## 2019-04-15 ENCOUNTER — Ambulatory Visit: Payer: BC Managed Care – PPO | Admitting: Podiatry

## 2019-04-15 ENCOUNTER — Ambulatory Visit (INDEPENDENT_AMBULATORY_CARE_PROVIDER_SITE_OTHER): Payer: BC Managed Care – PPO

## 2019-04-15 ENCOUNTER — Other Ambulatory Visit: Payer: Self-pay

## 2019-04-15 DIAGNOSIS — M79672 Pain in left foot: Secondary | ICD-10-CM

## 2019-04-15 DIAGNOSIS — M722 Plantar fascial fibromatosis: Secondary | ICD-10-CM

## 2019-04-16 ENCOUNTER — Encounter: Payer: Self-pay | Admitting: Podiatry

## 2019-04-16 ENCOUNTER — Other Ambulatory Visit: Payer: Self-pay | Admitting: Podiatry

## 2019-04-16 DIAGNOSIS — M722 Plantar fascial fibromatosis: Secondary | ICD-10-CM

## 2019-04-16 NOTE — Progress Notes (Signed)
Subjective:  Patient ID: Kathy Oneill, female    DOB: 11/07/1955,  MRN: [id number]  Chief Complaint  Patient presents with  . Foot Pain    pt has foot pain to the medial side of the right foot, pain has been going on for about 2 weeks, pain is elevated when walking or standing, pt has tried icing it, but not much has helped it.    64 y.o. female presents with the above complaint.  Patient presents with a right heel pain that has been going on for about 2 weeks.  Patient states it is aching pain.  Pain is elevated with walking and standing.  She has tried icing is does not help.  She denies any other acute complaints.  She was well-known to Dr. Charlsie Merles who had treated her in the past for mid arch plantar fasciitis.  She denies any other acute complaints.  It appears that today symptoms are very correlated with her previous symptoms 3 years ago.   Review of Systems: Negative except as noted in the HPI. Denies N/V/F/Ch.  Past Medical History:  Diagnosis Date  . Angio-edema   . Anxiety   . Arthritis    hands feet  . Back pain    L2-L3  . DDD (degenerative disc disease), lumbar   . Depression    history - no meds currently  . Endometrial polyp   . GERD (gastroesophageal reflux disease)   . Headache(784.0)    otc meds prn  . Hx of seasonal allergies   . IBS (irritable bowel syndrome)   . Lichen planus   . OA (osteoarthritis)   . Premenstrual syndrome   . SVD (spontaneous vaginal delivery)    x 2    Current Outpatient Medications:  .  calcium carbonate (OS-CAL) 600 MG TABS, Take 1,200 mg by mouth daily., Disp: , Rfl:  .  diphenhydrAMINE (BENADRYL ALLERGY) 25 MG tablet, Take by mouth., Disp: , Rfl:  .  EPINEPHrine (EPI-PEN) 0.3 mg/0.3 mL DEVI, Inject 0.3 mg into the muscle once., Disp: , Rfl:  .  ibuprofen (ADVIL,MOTRIN) 600 MG tablet, 1 po every 6 hours as needed for pain, Disp: 30 tablet, Rfl: 1 .  loratadine-pseudoephedrine (CLARITIN-D 12-HOUR) 5-120 MG tablet, Take by  mouth., Disp: , Rfl:  .  LORazepam (ATIVAN) 0.5 MG tablet, Take 0.5 mg by mouth at bedtime as needed for sleep., Disp: , Rfl:  .  loteprednol (ALREX) 0.2 % SUSP, 1 drop 2 (two) times daily as needed (for watery eyes)., Disp: , Rfl:  .  mometasone (ELOCON) 0.1 % ointment, Apply 1 time daily to rash until resolved., Disp: 45 g, Rfl: 1 .  propranolol (INDERAL) 10 MG tablet, Take 10 mg by mouth 2 (two) times daily as needed., Disp: , Rfl:   Social History   Tobacco Use  Smoking Status Never Smoker  Smokeless Tobacco Never Used    Allergies  Allergen Reactions  . Codeine Nausea And Vomiting   Objective:  There were no vitals filed for this visit. There is no height or weight on file to calculate BMI. Constitutional Well developed. Well nourished.  Vascular Dorsalis pedis pulses palpable bilaterally. Posterior tibial pulses palpable bilaterally. Capillary refill normal to all digits.  No cyanosis or clubbing noted. Pedal hair growth normal.  Neurologic Normal speech. Oriented to person, place, and time. Epicritic sensation to light touch grossly present bilaterally.  Dermatologic Nails well groomed and normal in appearance. No open wounds. No skin lesions.  Orthopedic: Normal joint ROM  without pain or crepitus bilaterally. No visible deformities. Tender to palpation at the calcaneal tuber left. No pain with calcaneal squeeze left. Ankle ROM diminished range of motion left. Silfverskiold Test: positive left.   Radiographs: Taken and reviewed. No acute fractures or dislocations. No evidence of stress fracture.  Plantar heel spur present. Posterior heel spur absent.   Assessment:   1. Plantar fasciitis of left foot    Plan:  Patient was evaluated and treated and all questions answered.  Plantar Fasciitis, left - XR reviewed as above.  - Educated on icing and stretching. Instructions given.  - Injection delivered to the plantar fascia as below. - DME: Plantar Fascial  Brace - Pharmacologic management: None - Procedure: Injection Tendon/Ligament Location: Left plantar fascia at the glabrous junction; medial approach. Skin Prep: alcohol Injectate: 0.5 cc 0.5% marcaine plain, 0.5 cc of 1% Lidocaine, 0.5 cc kenalog 10. Disposition: Patient tolerated procedure well. Injection site dressed with a band-aid.  Return in about 4 weeks (around 05/13/2019).

## 2019-05-13 ENCOUNTER — Ambulatory Visit: Payer: BC Managed Care – PPO | Admitting: Podiatry

## 2019-06-24 ENCOUNTER — Other Ambulatory Visit: Payer: Self-pay

## 2019-06-24 ENCOUNTER — Emergency Department
Admission: EM | Admit: 2019-06-24 | Discharge: 2019-06-24 | Disposition: A | Discharge status: Home / Self Care | Payer: BC Managed Care – PPO | Attending: Emergency Medicine | Admitting: Emergency Medicine

## 2019-06-24 ENCOUNTER — Emergency Department: Payer: BC Managed Care – PPO

## 2019-06-24 DIAGNOSIS — Y999 Unspecified external cause status: Secondary | ICD-10-CM | POA: Insufficient documentation

## 2019-06-24 DIAGNOSIS — K219 Gastro-esophageal reflux disease without esophagitis: Secondary | ICD-10-CM | POA: Diagnosis not present

## 2019-06-24 DIAGNOSIS — Y93B9 Activity, other involving muscle strengthening exercises: Secondary | ICD-10-CM | POA: Insufficient documentation

## 2019-06-24 DIAGNOSIS — X58XXXA Exposure to other specified factors, initial encounter: Secondary | ICD-10-CM | POA: Diagnosis not present

## 2019-06-24 DIAGNOSIS — R079 Chest pain, unspecified: Secondary | ICD-10-CM | POA: Diagnosis not present

## 2019-06-24 DIAGNOSIS — R1011 Right upper quadrant pain: Secondary | ICD-10-CM | POA: Diagnosis not present

## 2019-06-24 DIAGNOSIS — T148XXA Other injury of unspecified body region, initial encounter: Secondary | ICD-10-CM

## 2019-06-24 DIAGNOSIS — S39011A Strain of muscle, fascia and tendon of abdomen, initial encounter: Secondary | ICD-10-CM | POA: Insufficient documentation

## 2019-06-24 DIAGNOSIS — S3991XA Unspecified injury of abdomen, initial encounter: Secondary | ICD-10-CM | POA: Diagnosis not present

## 2019-06-24 DIAGNOSIS — Y929 Unspecified place or not applicable: Secondary | ICD-10-CM | POA: Insufficient documentation

## 2019-06-24 DIAGNOSIS — R109 Unspecified abdominal pain: Secondary | ICD-10-CM | POA: Diagnosis not present

## 2019-06-24 LAB — CBC
HCT: 42.8 % (ref 36.0–46.0)
Hemoglobin: 14.1 g/dL (ref 12.0–15.0)
MCH: 30.7 pg (ref 26.0–34.0)
MCHC: 32.9 g/dL (ref 30.0–36.0)
MCV: 93.2 fL (ref 80.0–100.0)
Platelets: 254 10*3/uL (ref 150–400)
RBC: 4.59 MIL/uL (ref 3.87–5.11)
RDW: 13.9 % (ref 11.5–15.5)
WBC: 5.1 10*3/uL (ref 4.0–10.5)
nRBC: 0 % (ref 0.0–0.2)

## 2019-06-24 LAB — URINALYSIS, COMPLETE (UACMP) WITH MICROSCOPIC
Bacteria, UA: NONE SEEN
Bilirubin Urine: NEGATIVE
Glucose, UA: NEGATIVE mg/dL
Hgb urine dipstick: NEGATIVE
Ketones, ur: NEGATIVE mg/dL
Nitrite: NEGATIVE
Protein, ur: NEGATIVE mg/dL
Specific Gravity, Urine: 1.004 — ABNORMAL LOW (ref 1.005–1.030)
pH: 7 (ref 5.0–8.0)

## 2019-06-24 LAB — COMPREHENSIVE METABOLIC PANEL
ALT: 26 U/L (ref 0–44)
AST: 25 U/L (ref 15–41)
Albumin: 4.2 g/dL (ref 3.5–5.0)
Alkaline Phosphatase: 61 U/L (ref 38–126)
Anion gap: 8 (ref 5–15)
BUN: 12 mg/dL (ref 8–23)
CO2: 28 mmol/L (ref 22–32)
Calcium: 8.9 mg/dL (ref 8.9–10.3)
Chloride: 105 mmol/L (ref 98–111)
Creatinine, Ser: 0.77 mg/dL (ref 0.44–1.00)
GFR calc Af Amer: >60 mL/min (ref >60)
GFR calc non Af Amer: >60 mL/min (ref >60)
Glucose, Bld: 109 mg/dL — ABNORMAL HIGH (ref 70–99)
Potassium: 3.7 mmol/L (ref 3.5–5.1)
Sodium: 141 mmol/L (ref 135–145)
Total Bilirubin: 0.7 mg/dL (ref 0.3–1.2)
Total Protein: 7.2 g/dL (ref 6.5–8.1)

## 2019-06-24 LAB — LIPASE, BLOOD: Lipase: 20 U/L (ref 11–51)

## 2019-06-24 IMAGING — CT CT ABD-PELV W/ CM
2 of 5 series · 16 of 46 positions shown, 18 images · IV contrast (APPLIED)
Comparison: None.

CLINICAL DATA: Right-sided abdominal pain

EXAM:
CT ABDOMEN AND PELVIS WITH CONTRAST
TECHNIQUE: Multidetector CT imaging of the abdomen and pelvis was performed
using the standard protocol following bolus administration of
intravenous contrast.
CONTRAST:  100mL OMNIPAQUE IOHEXOL 300 MG/ML  SOLN

[Series 2: routine abd/pel with · axial · 0.98mm/px · z∈[-540,-70]mm · 13 of 106 slices shown, 15 images]
[im 6/106  soft-tissue]
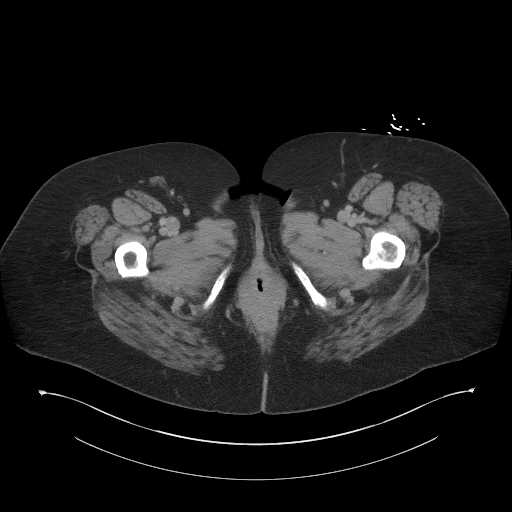
[im 6/106  bone]
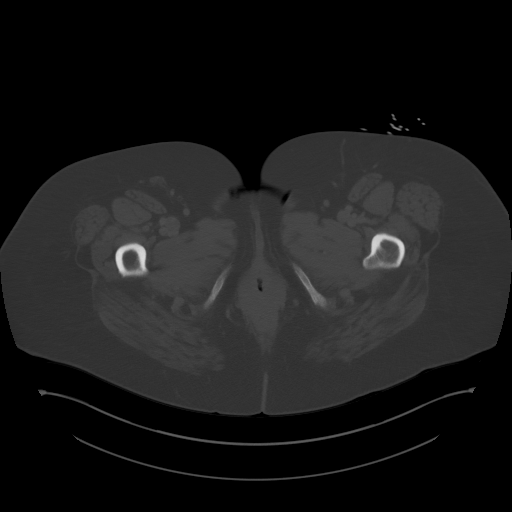
[im 17/106  soft-tissue]
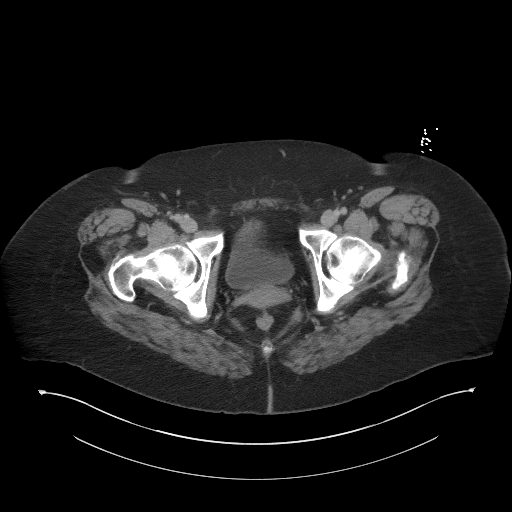
[im 23/106  soft-tissue]
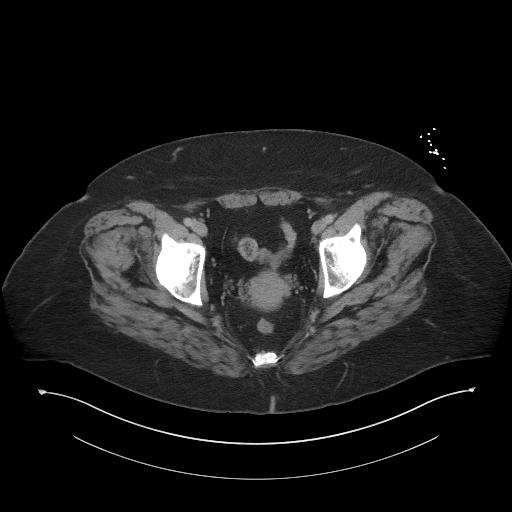
[im 28/106  soft-tissue]
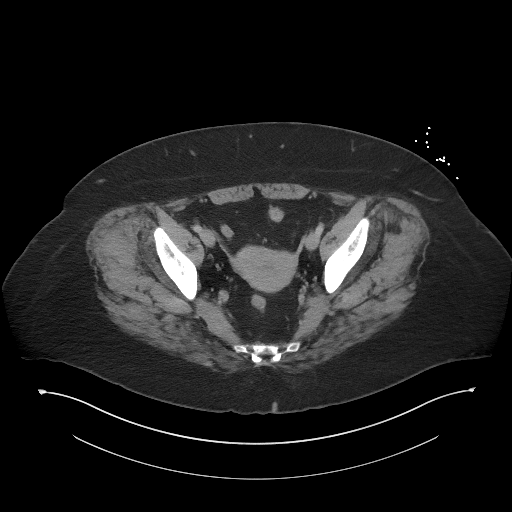
[im 39/106  soft-tissue]
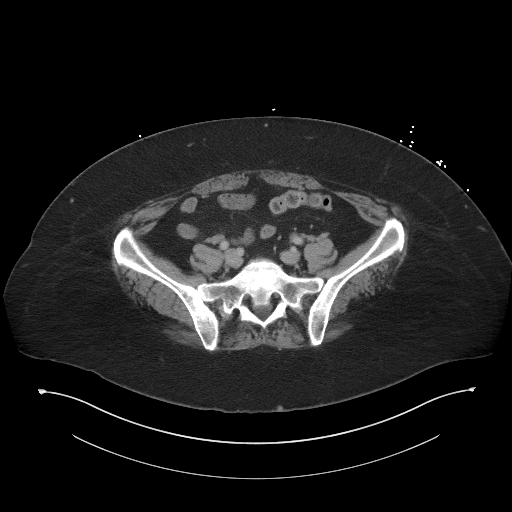
[im 45/106  soft-tissue]
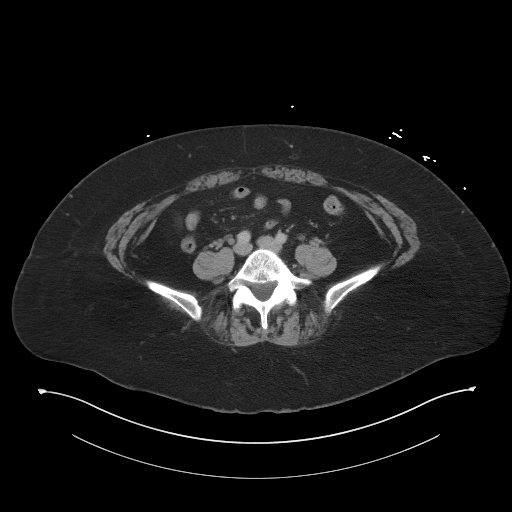
[im 56/106  soft-tissue]
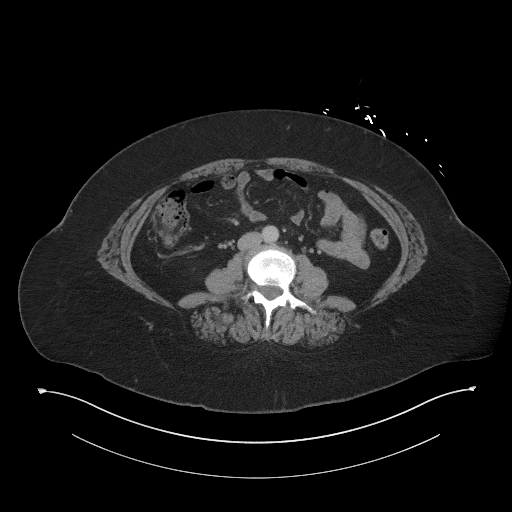
[im 61/106  soft-tissue]
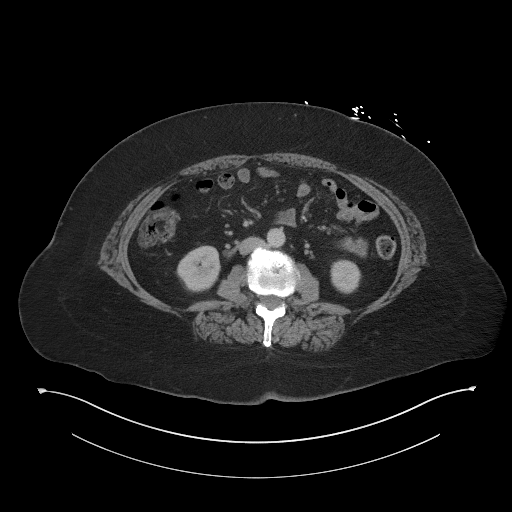
[im 67/106  soft-tissue]
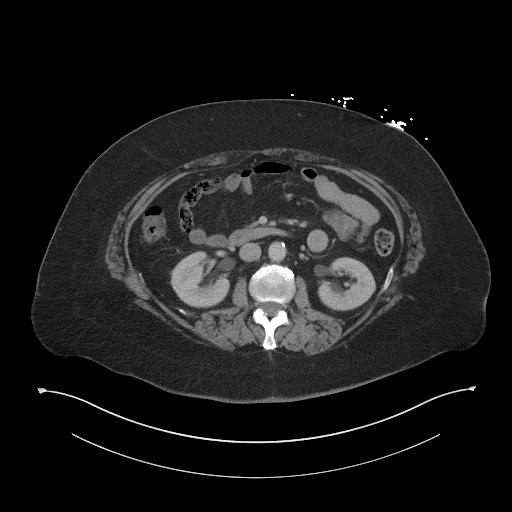
[im 67/106  bone]
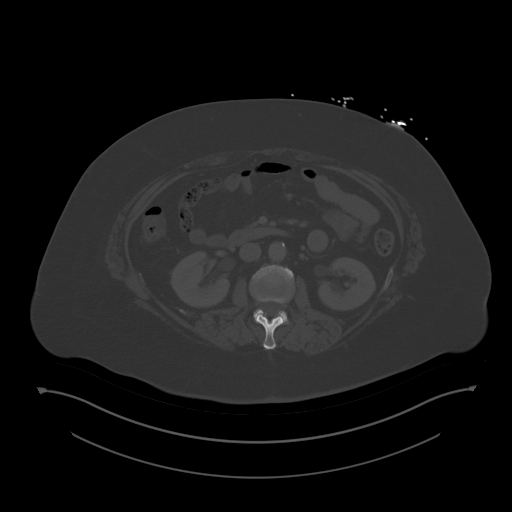
[im 78/106  soft-tissue]
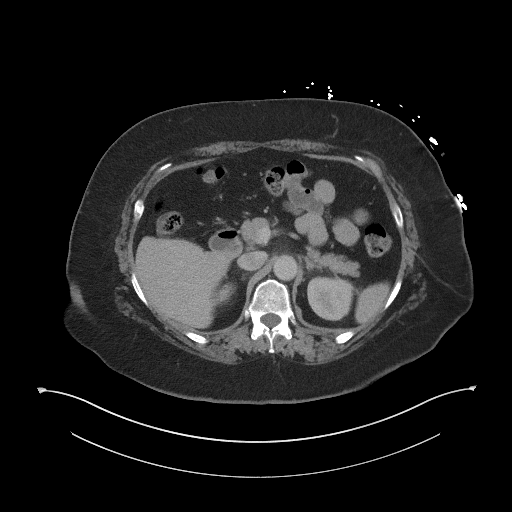
[im 83/106  soft-tissue]
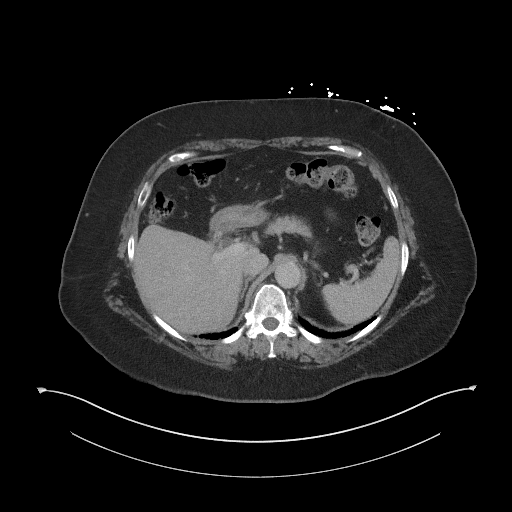
[im 89/106  soft-tissue]
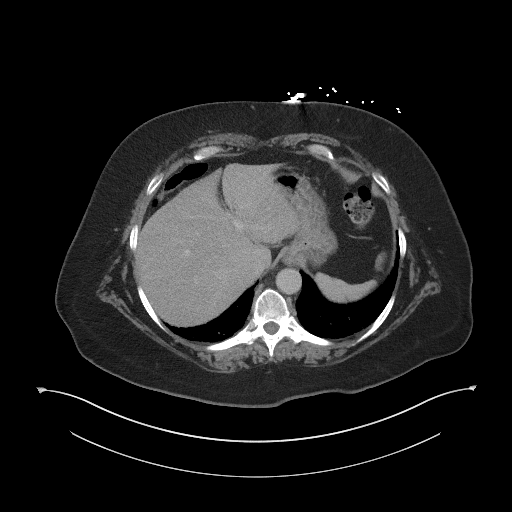
[im 100/106  soft-tissue]
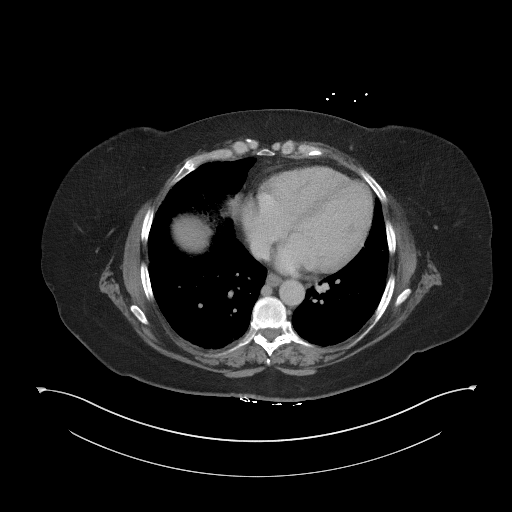

[Series 5: coronal st · coronal · 0.87mm/px · 3 of 97 slices shown]
[im 33/97  soft-tissue]
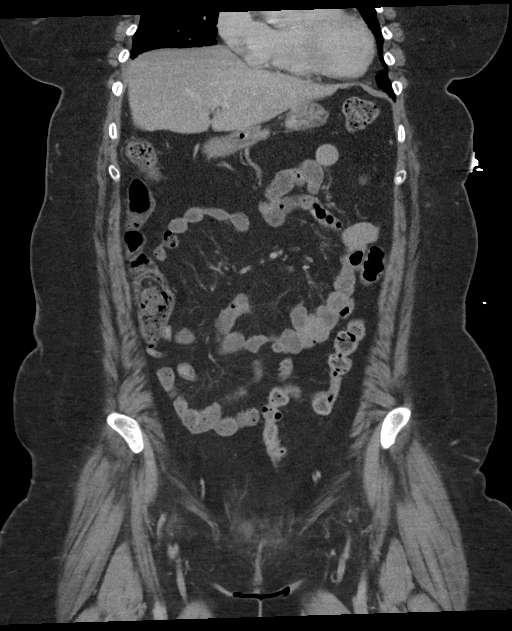
[im 43/97  soft-tissue]
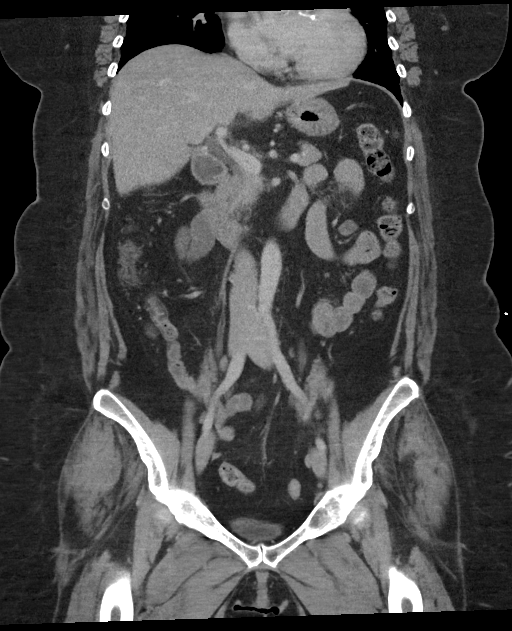
[im 54/97  soft-tissue]
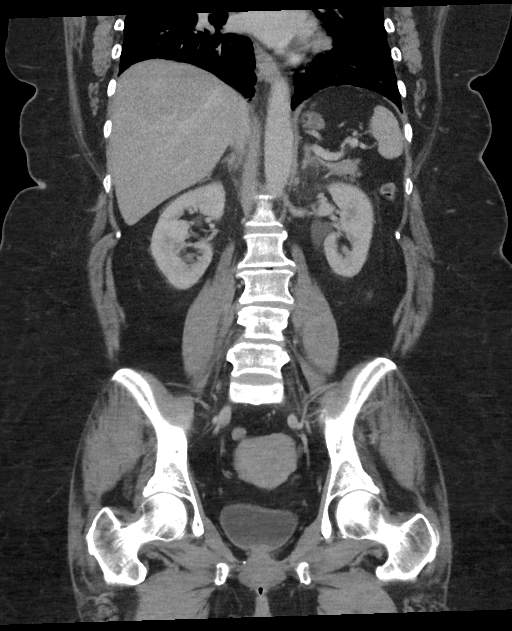

[16 of 46 positions shown; findings below may reference images not displayed]

FINDINGS: Lower chest: The visualized heart size within normal limits. No
pericardial fluid/thickening.

A small hiatal hernia.  There is

The visualized portions of the lungs are clear.

Hepatobiliary: The liver is normal in density without focal
abnormality.The main portal vein is patent. The patient is status
post cholecystectomy.

Pancreas: Unremarkable. No pancreatic ductal dilatation or
surrounding inflammatory changes.

Spleen: Normal in size without focal abnormality.

Adrenals/Urinary Tract: Both adrenal glands appear normal. The
kidneys and collecting system appear normal without evidence of
urinary tract calculus or hydronephrosis. Bladder is unremarkable.

Stomach/Bowel: The stomach, small bowel, and colon are normal in
appearance. No inflammatory changes, wall thickening, or obstructive
findings.Scattered colonic diverticula are noted without
diverticulitis.

Vascular/Lymphatic: There are no enlarged mesenteric,
retroperitoneal, or pelvic lymph nodes. Minimal scattered aortic
atherosclerosis seen.

Reproductive: The uterus and adnexa are unremarkable.

Other: No evidence of abdominal wall mass or hernia.

Musculoskeletal: No acute or significant osseous findings.
Degenerative changes seen in the thoracolumbar spine.
IMPRESSION: No acute intra-abdominal or pelvic pathology to explain the
patient's symptoms.

Diverticulosis without diverticulitis.

Aortic Atherosclerosis ([3J]-[3J]).

## 2019-06-24 MED ORDER — CYCLOBENZAPRINE HCL 5 MG PO TABS: 5.0000 mg | ORAL_TABLET | Freq: Three times a day (TID) | ORAL | 0 refills | Status: AC | PRN

## 2019-06-24 MED ORDER — LIDOCAINE 5 % EX PTCH: 1.0000 | MEDICATED_PATCH | Freq: Two times a day (BID) | CUTANEOUS | 0 refills | Status: AC

## 2019-06-24 MED ORDER — SODIUM CHLORIDE 0.9% FLUSH: 3.0000 mL | Freq: Once | INTRAVENOUS | Status: DC

## 2019-06-24 MED ORDER — IOHEXOL 300 MG/ML  SOLN
100.0000 mL | Freq: Once | INTRAMUSCULAR | Status: AC | PRN
  Administered 2019-06-24: 100 mL via INTRAVENOUS

## 2019-06-24 NOTE — ED Triage Notes (Signed)
Pt comes via POV from home with c/o right sided abdominal pain. Pt states it also run around to her back.  Pt states this started Friday even ing while during some stretches. Pt states it hurt some then but has since not improved.  Pt states right under right breast area. Pt denies any N/V/D

## 2019-06-24 NOTE — ED Notes (Signed)
Transported to CT 

## 2019-06-24 NOTE — ED Provider Notes (Signed)
[facility] Emergency Department Provider Note   ____________________________________________   First MD Initiated Contact with Patient 06/24/19 [phone]     (approximate)  I have reviewed the triage vital signs and the nursing notes.   HISTORY  Chief Complaint Abdominal Pain    HPI Kathy Oneill is a 64 y.o. female with past medical history of arthritis and GERD who presents to the ED complaining of abdominal pain.  Patient reports that she started to have pain in the right upper quadrant of her abdomen 2 days ago while she was doing some stretches for her back.  Pain seems to wrap around to her right flank from her right upper quadrant.  She describes the pain as constant and sharp, not exacerbated or alleviated by anything in particular.  She has not had any fevers, nausea, vomiting, diarrhea, constipation, dysuria, or hematuria.  She is status post cholecystectomy and denies any similar pain in the past.  She has not noticed any rashes to the area.        Past Medical History:  Diagnosis Date  . Angio-edema   . Anxiety   . Arthritis    hands feet  . Back pain    L2-L3  . DDD (degenerative disc disease), lumbar   . Depression    history - no meds currently  . Endometrial polyp   . GERD (gastroesophageal reflux disease)   . Headache(784.0)    otc meds prn  . Hx of seasonal allergies   . IBS (irritable bowel syndrome)   . Lichen planus   . OA (osteoarthritis)   . Premenstrual syndrome   . SVD (spontaneous vaginal delivery)    x 2    Patient Active Problem List   Diagnosis Date Noted  . History of adenomatous polyp of colon 04/18/2018  . Medial epicondylitis of elbow, right 01/12/2016  . Chronic right shoulder pain 01/05/2016  . Pain in both hands 01/05/2016  . Postmenopausal bleeding 10/14/2015  . Pain in the chest 05/30/2014    Past Surgical History:  Procedure Laterality Date  . ANTERIOR CRUCIATE LIGAMENT REPAIR  2015   replaced with donor  . CHOLECYSTECTOMY    . COLONOSCOPY    . DILATATION & CURETTAGE/HYSTEROSCOPY WITH MYOSURE N/A 10/14/2015   Procedure: DILATATION & CURETTAGE/HYSTEROSCOPY WITH MYOSURE;  Surgeon: Gerald Leitz, MD;  Location: [facility];  Service: Gynecology;  Laterality: N/A;  . DILATATION & CURRETTAGE/HYSTEROSCOPY WITH RESECTOCOPE N/A 07/12/2012   Procedure: DILATATION & CURETTAGE/HYSTEROSCOPY WITH RESECTOCOPE;  Surgeon: Dorien Chihuahua. Richardson Dopp, MD;  Location: [facility];  Service: Gynecology;  Laterality: N/A;  . DILATION AND CURETTAGE OF UTERUS     Dr. Thomasena Edis - polypectomy -   . TONSILLECTOMY    . TUBAL LIGATION    . WISDOM TOOTH EXTRACTION      Prior to Admission medications   Medication Sig Start Date End Date Taking? Authorizing Provider  calcium carbonate (OS-CAL) 600 MG TABS Take 1,200 mg by mouth daily.    [provider]  cyclobenzaprine (FLEXERIL) 5 MG tablet Take 1 tablet (5 mg total) by mouth 3 (three) times daily as needed for muscle spasms. 06/24/19   Chesley Noon, MD  diphenhydrAMINE (BENADRYL ALLERGY) 25 MG tablet Take by mouth.    [provider]  EPINEPHrine (EPI-PEN) 0.3 mg/0.3 mL DEVI Inject 0.3 mg into the muscle once.    [provider]  ibuprofen (ADVIL,MOTRIN) 600 MG tablet 1 po every 6 hours as needed for pain 10/14/15   Richardson Dopp,  Baxter Flattery, MD  lidocaine (LIDODERM) 5 % Place 1 patch onto the skin every 12 (twelve) hours. Remove & Discard patch within 12 hours or as directed by MD 06/24/19 06/23/20  Blake Divine, MD  loratadine-pseudoephedrine (CLARITIN-D 12-HOUR) 5-120 MG tablet Take by mouth.    [provider]  LORazepam (ATIVAN) 0.5 MG tablet Take 0.5 mg by mouth at bedtime as needed for sleep.    [provider]  loteprednol (ALREX) 0.2 % SUSP 1 drop 2 (two) times daily as needed (for watery eyes).    [provider]  mometasone (ELOCON) 0.1 % ointment Apply 1 time daily to rash until resolved. 05/22/18   Kozlow, Donnamarie Poag, MD  propranolol  (INDERAL) 10 MG tablet Take 10 mg by mouth 2 (two) times daily as needed. 12/14/18   [provider]    Allergies Codeine  Family History  Problem Relation Age of Onset  . Cancer Mother        urine ca  . Breast cancer Sister     Social History Social History   Tobacco Use  . Smoking status: Never Smoker  . Smokeless tobacco: Never Used  Substance Use Topics  . Alcohol use: Yes    Alcohol/week: 2.0 standard drinks    Types: 2 Shots of liquor per week    Comment: socially mixed drinks  . Drug use: No    Review of Systems  Constitutional: No fever/chills Eyes: No visual changes. ENT: No sore throat. Cardiovascular: Denies chest pain. Respiratory: Denies shortness of breath. Gastrointestinal: Positive for abdominal pain.  No nausea, no vomiting.  No diarrhea.  No constipation. Genitourinary: Negative for dysuria. Musculoskeletal: Negative for back pain. Skin: Negative for rash. Neurological: Negative for headaches, focal weakness or numbness.  ____________________________________________   PHYSICAL EXAM:  VITAL SIGNS: ED Triage Vitals [06/24/19 0856]  Enc Vitals Group     BP 128/69     Pulse Rate 74     Resp 19     Temp 98.1 F (36.7 C)     Temp src      SpO2 100 %     Weight 242 lb (109.8 kg)     Height 5\' 7"  (1.702 m)     Head Circumference      Peak Flow      Pain Score 7     Pain Loc      Pain Edu?      Excl. in [place]?     Constitutional: Alert and oriented. Eyes: Conjunctivae are normal. Head: Atraumatic. Nose: No congestion/rhinnorhea. Mouth/Throat: Mucous membranes are moist. Neck: Normal ROM Cardiovascular: Normal rate, regular rhythm. Grossly normal heart sounds. Respiratory: Normal respiratory effort.  No retractions. Lungs CTAB. Gastrointestinal: Soft and tender to palpation in the right upper quadrant with no rebound or guarding.  No CVA tenderness bilaterally. No distention. Genitourinary: deferred Musculoskeletal: No lower  extremity tenderness nor edema. Neurologic:  Normal speech and language. No gross focal neurologic deficits are appreciated. Skin:  Skin is warm, dry and intact. No rash noted. Psychiatric: Mood and affect are normal. Speech and behavior are normal.  ____________________________________________   LABS (all labs ordered are listed, but only abnormal results are displayed)  Labs Reviewed  COMPREHENSIVE METABOLIC PANEL - Abnormal; Notable for the following components:      Result Value   Glucose, Bld 109 (*)    All other components within normal limits  URINALYSIS, COMPLETE (UACMP) WITH MICROSCOPIC - Abnormal; Notable for the following components:   Color, Urine  STRAW (*)    APPearance CLEAR (*)    Specific Gravity, Urine 1.004 (*)    Leukocytes,Ua TRACE (*)    All other components within normal limits  LIPASE, BLOOD  CBC   ____________________________________________  EKG  ED ECG REPORT I, Chesley Noon, the attending physician, personally viewed and interpreted this ECG.   Date: 06/24/2019  EKG Time: 9:31  Rate: 66  Rhythm: normal sinus rhythm  Axis: Normal  Intervals:none  ST&T Change: None   PROCEDURES  Procedure(s) performed (including Critical Care):  Procedures   ____________________________________________   INITIAL IMPRESSION / ASSESSMENT AND PLAN / ED COURSE       64 year old female who is status post cholecystectomy presents to the ED with 2 days of constant right upper quadrant abdominal pain with no other symptoms.  She does have tenderness in her right upper quadrant on exam and we will further assess with CT scan.  Doubt cardiac etiology given symptoms reproducible with palpation, will screen EKG.  Also plan to check labs including CMP and lipase.  Differential includes pancreatitis, hepatitis, retained gallstones, gastritis, muscle strain, pneumonia.  CT scan is negative for acute process and lab work is unremarkable, UA without evidence of  infection.  At this point, muscle strain seems the most likely etiology for patient's symptoms given onset with stretching.  Will prescribe short course of muscle relaxants as well as Lidoderm patches, patient counseled to take Tylenol as needed as well.  She was counseled to follow-up with her PCP and to return to the ED for new worsening symptoms.  Patient agrees with plan.      ____________________________________________   FINAL CLINICAL IMPRESSION(S) / ED DIAGNOSES  Final diagnoses:  Right upper quadrant abdominal pain  Muscle strain     ED Discharge Orders         Ordered    cyclobenzaprine (FLEXERIL) 5 MG tablet  3 times daily PRN     06/24/19 1219    lidocaine (LIDODERM) 5 %  Every 12 hours     06/24/19 1219           Note:  This document was prepared using Dragon voice recognition software and may include unintentional dictation errors.   Chesley Noon, MD 06/24/19 1220

## 2019-06-25 DIAGNOSIS — Z01419 Encounter for gynecological examination (general) (routine) without abnormal findings: Secondary | ICD-10-CM | POA: Diagnosis not present

## 2019-07-31 ENCOUNTER — Other Ambulatory Visit: Payer: Self-pay | Admitting: Physician Assistant

## 2019-07-31 DIAGNOSIS — Z1231 Encounter for screening mammogram for malignant neoplasm of breast: Secondary | ICD-10-CM

## 2019-08-05 DIAGNOSIS — M9901 Segmental and somatic dysfunction of cervical region: Secondary | ICD-10-CM | POA: Diagnosis not present

## 2019-08-05 DIAGNOSIS — M9904 Segmental and somatic dysfunction of sacral region: Secondary | ICD-10-CM | POA: Diagnosis not present

## 2019-08-05 DIAGNOSIS — M9902 Segmental and somatic dysfunction of thoracic region: Secondary | ICD-10-CM | POA: Diagnosis not present

## 2019-08-05 DIAGNOSIS — M9903 Segmental and somatic dysfunction of lumbar region: Secondary | ICD-10-CM | POA: Diagnosis not present

## 2019-08-07 DIAGNOSIS — M5030 Other cervical disc degeneration, unspecified cervical region: Secondary | ICD-10-CM | POA: Diagnosis not present

## 2019-08-07 DIAGNOSIS — M5124 Other intervertebral disc displacement, thoracic region: Secondary | ICD-10-CM | POA: Diagnosis not present

## 2019-08-07 DIAGNOSIS — M5137 Other intervertebral disc degeneration, lumbosacral region: Secondary | ICD-10-CM | POA: Diagnosis not present

## 2019-08-13 DIAGNOSIS — M5030 Other cervical disc degeneration, unspecified cervical region: Secondary | ICD-10-CM | POA: Diagnosis not present

## 2019-08-13 DIAGNOSIS — M5124 Other intervertebral disc displacement, thoracic region: Secondary | ICD-10-CM | POA: Diagnosis not present

## 2019-08-13 DIAGNOSIS — M5137 Other intervertebral disc degeneration, lumbosacral region: Secondary | ICD-10-CM | POA: Diagnosis not present

## 2019-09-29 DIAGNOSIS — R197 Diarrhea, unspecified: Secondary | ICD-10-CM | POA: Diagnosis not present

## 2019-09-29 DIAGNOSIS — R42 Dizziness and giddiness: Secondary | ICD-10-CM | POA: Diagnosis not present

## 2019-09-30 DIAGNOSIS — R42 Dizziness and giddiness: Secondary | ICD-10-CM | POA: Diagnosis not present

## 2019-09-30 DIAGNOSIS — R197 Diarrhea, unspecified: Secondary | ICD-10-CM | POA: Diagnosis not present

## 2019-10-11 DIAGNOSIS — M5137 Other intervertebral disc degeneration, lumbosacral region: Secondary | ICD-10-CM | POA: Diagnosis not present

## 2019-10-11 DIAGNOSIS — M5030 Other cervical disc degeneration, unspecified cervical region: Secondary | ICD-10-CM | POA: Diagnosis not present

## 2019-10-11 DIAGNOSIS — M5124 Other intervertebral disc displacement, thoracic region: Secondary | ICD-10-CM | POA: Diagnosis not present

## 2019-10-15 DIAGNOSIS — M5124 Other intervertebral disc displacement, thoracic region: Secondary | ICD-10-CM | POA: Diagnosis not present

## 2019-10-15 DIAGNOSIS — M5030 Other cervical disc degeneration, unspecified cervical region: Secondary | ICD-10-CM | POA: Diagnosis not present

## 2019-10-15 DIAGNOSIS — M5137 Other intervertebral disc degeneration, lumbosacral region: Secondary | ICD-10-CM | POA: Diagnosis not present

## 2019-10-31 DIAGNOSIS — R197 Diarrhea, unspecified: Secondary | ICD-10-CM | POA: Diagnosis not present

## 2019-11-18 DIAGNOSIS — Z1239 Encounter for other screening for malignant neoplasm of breast: Secondary | ICD-10-CM | POA: Diagnosis not present

## 2019-11-18 DIAGNOSIS — K635 Polyp of colon: Secondary | ICD-10-CM | POA: Diagnosis not present

## 2019-11-18 DIAGNOSIS — Z789 Other specified health status: Secondary | ICD-10-CM | POA: Diagnosis not present

## 2019-11-29 DIAGNOSIS — R197 Diarrhea, unspecified: Secondary | ICD-10-CM | POA: Diagnosis not present

## 2021-05-21 ENCOUNTER — Other Ambulatory Visit (HOSPITAL_COMMUNITY)
Admission: RE | Admit: 2021-05-21 | Discharge: 2021-05-21 | Disposition: A | Discharge status: Home / Self Care | Payer: BC Managed Care – PPO | Source: Ambulatory Visit | Attending: Obstetrics and Gynecology | Admitting: Obstetrics and Gynecology

## 2021-05-21 ENCOUNTER — Other Ambulatory Visit: Payer: Self-pay | Admitting: Obstetrics and Gynecology

## 2021-05-21 DIAGNOSIS — Z01419 Encounter for gynecological examination (general) (routine) without abnormal findings: Secondary | ICD-10-CM | POA: Insufficient documentation

## 2021-05-27 LAB — CYTOLOGY - PAP
Comment: NEGATIVE
Diagnosis: UNDETERMINED — AB
High risk HPV: NEGATIVE

## 2022-05-27 ENCOUNTER — Other Ambulatory Visit (HOSPITAL_COMMUNITY)
Admission: RE | Admit: 2022-05-27 | Discharge: 2022-05-27 | Disposition: A | Discharge status: Home / Self Care | Payer: BC Managed Care – PPO | Source: Ambulatory Visit | Attending: Obstetrics and Gynecology | Admitting: Obstetrics and Gynecology

## 2022-05-27 ENCOUNTER — Other Ambulatory Visit: Payer: Self-pay | Admitting: Obstetrics and Gynecology

## 2022-05-27 DIAGNOSIS — Z01419 Encounter for gynecological examination (general) (routine) without abnormal findings: Secondary | ICD-10-CM | POA: Insufficient documentation

## 2022-06-02 LAB — CYTOLOGY - PAP
Comment: NEGATIVE
Diagnosis: NEGATIVE
High risk HPV: NEGATIVE
